# Patient Record
Sex: Female | Born: 1985 | Race: Black or African American | Hispanic: No | Marital: Single | Smoking: Never smoker
Health system: Southern US, Community
[De-identification: ages and names within clinical notes are randomized; demographics above are authoritative.]

## PROBLEM LIST (undated history)

## (undated) HISTORY — PX: LIPOSUCTION: SHX10

---

## 2016-01-21 ENCOUNTER — Encounter: Payer: Self-pay | Admitting: Family Medicine

## 2016-02-09 ENCOUNTER — Encounter: Payer: Self-pay | Admitting: Family Medicine

## 2016-12-06 ENCOUNTER — Encounter: Payer: Self-pay | Admitting: Family Medicine

## 2018-03-03 DIAGNOSIS — Z3202 Encounter for pregnancy test, result negative: Secondary | ICD-10-CM | POA: Diagnosis not present

## 2018-12-29 ENCOUNTER — Telehealth: Payer: Self-pay | Admitting: Obstetrics & Gynecology

## 2018-12-29 NOTE — Telephone Encounter (Signed)
Left a voicemail message instructing the patient of how enter the virtual visit via mychart. Informed the patient of logging in 15 minutes prior to the appointment and if she has any questions please call our office at [phone]. °

## 2018-12-30 ENCOUNTER — Encounter: Payer: Self-pay | Admitting: Student

## 2019-01-08 ENCOUNTER — Ambulatory Visit (INDEPENDENT_AMBULATORY_CARE_PROVIDER_SITE_OTHER): Payer: Medicaid Other | Admitting: Medical

## 2019-01-08 ENCOUNTER — Other Ambulatory Visit (HOSPITAL_COMMUNITY)
Admission: RE | Admit: 2019-01-08 | Discharge: 2019-01-08 | Disposition: A | Discharge status: Home / Self Care | Payer: Medicaid Other | Source: Ambulatory Visit | Attending: Medical | Admitting: Medical

## 2019-01-08 ENCOUNTER — Other Ambulatory Visit: Payer: Self-pay

## 2019-01-08 ENCOUNTER — Encounter: Payer: Self-pay | Admitting: Medical

## 2019-01-08 ENCOUNTER — Other Ambulatory Visit: Payer: Self-pay | Admitting: Medical

## 2019-01-08 VITALS — BP 147/106 | HR 78 | Ht 62.0 in | Wt 169.0 lb

## 2019-01-08 DIAGNOSIS — N644 Mastodynia: Secondary | ICD-10-CM

## 2019-01-08 DIAGNOSIS — Z01419 Encounter for gynecological examination (general) (routine) without abnormal findings: Secondary | ICD-10-CM | POA: Diagnosis not present

## 2019-01-08 DIAGNOSIS — Z0000 Encounter for general adult medical examination without abnormal findings: Secondary | ICD-10-CM

## 2019-01-08 DIAGNOSIS — R030 Elevated blood-pressure reading, without diagnosis of hypertension: Secondary | ICD-10-CM

## 2019-01-08 NOTE — Progress Notes (Signed)
Scheduled appt at [facility] at [facility] 8/10 @ 310p. Also scheduled appt with the [facility] 7/28 @ 150pm

## 2019-01-08 NOTE — Progress Notes (Signed)
Subjective:    Vanessa Solis is a 33 y.o. female who presents for an annual exam. The patient is sexually active. GYN screening history: last pap: approximate date 2018 and was normal. The patient wears seatbelts: yes. The patient participates in regular exercise: not asked. Has the patient ever been transfused or tattooed?: not asked. The patient reports that there is not domestic violence in her life.   The patient has recently moved here from [place] and would like to establish care. She denies any history of abnormal pap smears. She has regular periods. LMP 12/12/18. She is not currently using hormonal birth control. She uses NFP. She is sexually active with one partner. She does not desire pregnancy at this time.   She has had intermittent breast pain in the left breast. This was evaluated a few years ago in [place] and per patient was normal. She states pain has been more frequent and she has noted itching in the area as well.   She denies any other PMH or current medical problems.    Menstrual History: OB History    Gravida  2   Para  2   Term  2   Preterm  0   AB  0   Living  2     SAB  0   TAB  0   Ectopic  0   Multiple  0   Live Births  2           Patient's last menstrual period was 12/12/2018 (exact date).    The following portions of the patient's history were reviewed and updated as appropriate: allergies, current medications, past family history, past medical history, past social history, past surgical history and problem list.  Review of Systems Pertinent items are noted in HPI.    Objective:     Physical Exam  Nursing note and vitals reviewed. Constitutional: She is oriented to person, place, and time. She appears well-developed and well-nourished. No distress.  HENT:  Head: Normocephalic and atraumatic.  Neck: Normal range of motion. Neck supple. No thyromegaly present.  Cardiovascular: Normal rate, regular rhythm and normal heart sounds.   No murmur heard. Respiratory: Effort normal and breath sounds normal. No respiratory distress. She has no wheezes. Right breast exhibits no inverted nipple, no mass, no nipple discharge, no skin change and no tenderness. Left breast exhibits no inverted nipple, no mass, no nipple discharge, no skin change and no tenderness. No breast swelling, tenderness, discharge or bleeding. Breasts are symmetrical.  GI: Soft. Bowel sounds are normal. She exhibits no distension and no mass. There is no abdominal tenderness. There is no rebound and no guarding.  Few possible fibrocystic changes noted in the breast tissue. No focal masses or tenderness to palpation.   Genitourinary: There is no rash or tenderness on the right labia. There is no rash or tenderness on the left labia. Uterus is not enlarged and not tender. Cervix exhibits no motion tenderness, no discharge and no friability. Right adnexum displays no mass and no tenderness. Left adnexum displays no mass and no tenderness.    No vaginal discharge or bleeding.  No bleeding in the vagina.    Genitourinary Comments: Scant bleeding following pap smear   Musculoskeletal:        General: No edema.  Neurological: She is alert and oriented to person, place, and time.  Skin: Skin is warm and dry. No erythema.  Psychiatric: She has a normal mood and affect.   Marland Kitchen  Assessment:    Healthy female exam.   Breast pain  Elevated blood pressure    Plan:     Await pap smear results.   Patient will be contacted with abnormal results  Patient agrees to STD testing today  Breast US scheduled at the Breast center for further evaluation of breast pain  Referral to [facility] for HTN management   Marny LowensteinWenzel,  N, PA-C 01/08/2019 12:09 PM

## 2019-01-08 NOTE — Patient Instructions (Signed)
Pap Test Why am I having this test? A Pap test, also called a Pap smear, is a screening test to check for signs of:  Cancer of the vagina, cervix, and uterus. The cervix is the lower part of the uterus that opens into the vagina.  Infection.  Changes that may be a sign that cancer is developing (precancerous changes). Women need this test on a regular basis. In general, you should have a Pap test every 3 years until you reach menopause or age 33. Women aged 30-60 may choose to have their Pap test done at the same time as an HPV (human papillomavirus) test every 5 years (instead of every 3 years). Your health care provider may recommend having Pap tests more or less often depending on your medical conditions and past Pap test results. What kind of sample is taken?  Your health care provider will collect a sample of cells from the surface of your cervix. This will be done using a small cotton swab, plastic spatula, or brush. This sample is often collected during a pelvic exam, when you are lying on your back on an exam table with feet in footrests (stirrups). In some cases, fluids (secretions) from the cervix or vagina may also be collected. How do I prepare for this test?  Be aware of where you are in your menstrual cycle. If you are menstruating on the day of the test, you may be asked to reschedule.  You may need to reschedule if you have a known vaginal infection on the day of the test.  Follow instructions from your health care provider about: ? Changing or stopping your regular medicines. Some medicines can cause abnormal test results, such as digitalis and tetracycline. ? Avoiding douching or taking a bath the day before or the day of the test. Tell a health care provider about:  Any allergies you have.  All medicines you are taking, including vitamins, herbs, eye drops, creams, and over-the-counter medicines.  Any blood disorders you have.  Any surgeries you have had.  Any  medical conditions you have.  Whether you are pregnant or may be pregnant. How are the results reported? Your test results will be reported as either abnormal or normal. A false-positive result can occur. A false positive is incorrect because it means that a condition is present when it is not. A false-negative result can occur. A false negative is incorrect because it means that a condition is not present when it is. What do the results mean? A normal test result means that you do not have signs of cancer of the vagina, cervix, or uterus. An abnormal result may mean that you have:  Cancer. A Pap test by itself is not enough to diagnose cancer. You will have more tests done in this case.  Precancerous changes in your vagina, cervix, or uterus.  Inflammation of the cervix.  An STD (sexually transmitted disease).  A fungal infection.  A parasite infection. Talk with your health care provider about what your results mean. Questions to ask your health care provider Ask your health care provider, or the department that is doing the test:  When will my results be ready?  How will I get my results?  What are my treatment options?  What other tests do I need?  What are my next steps? Summary  In general, women should have a Pap test every 3 years until they reach menopause or age 33.  Your health care provider will collect a   sample of cells from the surface of your cervix. This will be done using a small cotton swab, plastic spatula, or brush.  In some cases, fluids (secretions) from the cervix or vagina may also be collected. This information is not intended to replace advice given to you by your health care provider. Make sure you discuss any questions you have with your health care provider. Document Released: 09/01/2002 Document Revised: 02/18/2017 Document Reviewed: 02/18/2017 Elsevier Patient Education  2020 [employer]. Hypertension, Adult Hypertension is another name  for high blood pressure. High blood pressure forces your heart to work harder to pump blood. This can cause problems over time. There are two numbers in a blood pressure reading. There is a top number (systolic) over a bottom number (diastolic). It is best to have a blood pressure that is below 120/80. Healthy choices can help lower your blood pressure, or you may need medicine to help lower it. What are the causes? The cause of this condition is not known. Some conditions may be related to high blood pressure. What increases the risk?  Smoking.  Having type 2 diabetes mellitus, high cholesterol, or both.  Not getting enough exercise or physical activity.  Being overweight.  Having too much fat, sugar, calories, or salt (sodium) in your diet.  Drinking too much alcohol.  Having long-term (chronic) kidney disease.  Having a family history of high blood pressure.  Age. Risk increases with age.  Race. You may be at higher risk if you are African American.  Gender. Men are at higher risk than women before age 25. After age 59, women are at higher risk than men.  Having obstructive sleep apnea.  Stress. What are the signs or symptoms?  High blood pressure may not cause symptoms. Very high blood pressure (hypertensive crisis) may cause: ? Headache. ? Feelings of worry or nervousness (anxiety). ? Shortness of breath. ? Nosebleed. ? A feeling of being sick to your stomach (nausea). ? Throwing up (vomiting). ? Changes in how you see. ? Very bad chest pain. ? Seizures. How is this treated?  This condition is treated by making healthy lifestyle changes, such as: ? Eating healthy foods. ? Exercising more. ? Drinking less alcohol.  Your health care provider may prescribe medicine if lifestyle changes are not enough to get your blood pressure under control, and if: ? Your top number is above 130. ? Your bottom number is above 80.  Your personal target blood pressure may vary.  Follow these instructions at home: Eating and drinking   If told, follow the DASH eating plan. To follow this plan: ? Fill one half of your plate at each meal with fruits and vegetables. ? Fill one fourth of your plate at each meal with whole grains. Whole grains include whole-wheat pasta, brown rice, and whole-grain bread. ? Eat or drink low-fat dairy products, such as skim milk or low-fat yogurt. ? Fill one fourth of your plate at each meal with low-fat (lean) proteins. Low-fat proteins include fish, chicken without skin, eggs, beans, and tofu. ? Avoid fatty meat, cured and processed meat, or chicken with skin. ? Avoid pre-made or processed food.  Eat less than 1,500 mg of salt each day.  Do not drink alcohol if: ? Your doctor tells you not to drink. ? You are pregnant, may be pregnant, or are planning to become pregnant.  If you drink alcohol: ? Limit how much you use to:  0-1 drink a day for women.  0-2 drinks  a day for men. ? Be aware of how much alcohol is in your drink. In the [place] one drink equals one 12 oz bottle of beer (355 mL), one 5 oz glass of wine (148 mL), or one 1 oz glass of hard liquor (44 mL). Lifestyle   Work with your doctor to stay at a healthy weight or to lose weight. Ask your doctor what the best weight is for you.  Get at least 30 minutes of exercise most days of the week. This may include walking, swimming, or biking.  Get at least 30 minutes of exercise that strengthens your muscles (resistance exercise) at least 3 days a week. This may include lifting weights or doing Pilates.  Do not use any products that contain nicotine or tobacco, such as cigarettes, e-cigarettes, and chewing tobacco. If you need help quitting, ask your doctor.  Check your blood pressure at home as told by your doctor.  Keep all follow-up visits as told by your doctor. This is important. Medicines  Take over-the-counter and prescription medicines only as told by your  doctor. Follow directions carefully.  Do not skip doses of blood pressure medicine. The medicine does not work as well if you skip doses. Skipping doses also puts you at risk for problems.  Ask your doctor about side effects or reactions to medicines that you should watch for. Contact a doctor if you:  Think you are having a reaction to the medicine you are taking.  Have headaches that keep coming back (recurring).  Feel dizzy.  Have swelling in your ankles.  Have trouble with your vision. Get help right away if you:  Get a very bad headache.  Start to feel mixed up (confused).  Feel weak or numb.  Feel faint.  Have very bad pain in your: ? Chest. ? Belly (abdomen).  Throw up more than once.  Have trouble breathing. Summary  Hypertension is another name for high blood pressure.  High blood pressure forces your heart to work harder to pump blood.  For most people, a normal blood pressure is less than 120/80.  Making healthy choices can help lower blood pressure. If your blood pressure does not get lower with healthy choices, you may need to take medicine. This information is not intended to replace advice given to you by your health care provider. Make sure you discuss any questions you have with your health care provider. Document Released: 11/28/2007 Document Revised: 02/19/2018 Document Reviewed: 02/19/2018 Elsevier Patient Education  2020 [employer]

## 2019-01-14 LAB — CYTOLOGY - PAP
Chlamydia: NEGATIVE
Diagnosis: NEGATIVE
HPV: NOT DETECTED
Neisseria Gonorrhea: NEGATIVE
Trichomonas: NEGATIVE

## 2019-01-20 ENCOUNTER — Other Ambulatory Visit: Payer: Medicaid Other

## 2019-01-28 ENCOUNTER — Other Ambulatory Visit: Payer: Medicaid Other

## 2019-02-02 ENCOUNTER — Ambulatory Visit: Payer: Medicaid Other | Admitting: Family Medicine

## 2019-02-05 ENCOUNTER — Other Ambulatory Visit: Payer: Self-pay | Admitting: Medical

## 2019-02-06 ENCOUNTER — Ambulatory Visit: Payer: Medicaid Other

## 2019-02-06 ENCOUNTER — Other Ambulatory Visit: Payer: Self-pay

## 2019-02-06 ENCOUNTER — Ambulatory Visit
Admission: RE | Admit: 2019-02-06 | Discharge: 2019-02-06 | Disposition: A | Discharge status: Home / Self Care | Payer: Medicaid Other | Source: Ambulatory Visit | Attending: Medical | Admitting: Medical

## 2019-02-06 DIAGNOSIS — N644 Mastodynia: Secondary | ICD-10-CM

## 2019-02-06 DIAGNOSIS — R928 Other abnormal and inconclusive findings on diagnostic imaging of breast: Secondary | ICD-10-CM | POA: Diagnosis not present

## 2019-02-20 ENCOUNTER — Telehealth: Payer: Self-pay

## 2019-02-20 NOTE — Telephone Encounter (Signed)
Called patient to do their pre-visit COVID screening.  Call went to voicemail. Unable to do prescreening.  

## 2019-02-23 ENCOUNTER — Ambulatory Visit: Payer: Medicaid Other | Admitting: Family Medicine

## 2019-04-22 DIAGNOSIS — I10 Essential (primary) hypertension: Secondary | ICD-10-CM | POA: Diagnosis not present

## 2019-04-22 DIAGNOSIS — R58 Hemorrhage, not elsewhere classified: Secondary | ICD-10-CM | POA: Diagnosis not present

## 2019-04-22 DIAGNOSIS — W19XXXA Unspecified fall, initial encounter: Secondary | ICD-10-CM | POA: Diagnosis not present

## 2019-04-28 ENCOUNTER — Ambulatory Visit (HOSPITAL_COMMUNITY)
Admission: EM | Admit: 2019-04-28 | Discharge: 2019-04-28 | Disposition: A | Discharge status: Home / Self Care | Payer: Medicaid Other | Attending: Emergency Medicine | Admitting: Emergency Medicine

## 2019-04-28 DIAGNOSIS — Z4802 Encounter for removal of sutures: Secondary | ICD-10-CM

## 2019-04-28 NOTE — ED Triage Notes (Signed)
Pt presents to have 8 sutures removed from forehead from an injury from a few days ago.

## 2019-11-02 ENCOUNTER — Encounter (HOSPITAL_COMMUNITY): Payer: Self-pay

## 2019-11-02 ENCOUNTER — Ambulatory Visit (HOSPITAL_COMMUNITY)
Admission: EM | Admit: 2019-11-02 | Discharge: 2019-11-02 | Disposition: A | Discharge status: Home / Self Care | Payer: Medicaid Other | Attending: Family Medicine | Admitting: Family Medicine

## 2019-11-02 ENCOUNTER — Other Ambulatory Visit: Payer: Self-pay

## 2019-11-02 DIAGNOSIS — I10 Essential (primary) hypertension: Secondary | ICD-10-CM | POA: Diagnosis not present

## 2019-11-02 DIAGNOSIS — K011 Impacted teeth: Secondary | ICD-10-CM | POA: Diagnosis not present

## 2019-11-02 MED ORDER — AMLODIPINE BESYLATE 5 MG PO TABS: 5.0000 mg | ORAL_TABLET | Freq: Every day | ORAL | 1 refills | Status: DC

## 2019-11-02 NOTE — Discharge Instructions (Addendum)
Keep your follow up appointment with your new primary care provider. You may return here if needed.

## 2019-11-02 NOTE — ED Provider Notes (Signed)
[facility]   [id number] 11/02/19 Arrival Time: 1045  ASSESSMENT & PLAN:  1. Essential hypertension     BegiN: Meds ordered this encounter  Medications  . amLODipine (NORVASC) 5 MG tablet    Sig: Take 1 tablet (5 mg total) by mouth daily.    Dispense:  30 tablet    Refill:  1    Has scheduled f/u with new PCP. May f/u here as needed. See AVS for written information on HTN.   Reviewed expectations re: course of current medical issues. Questions answered. Outlined signs and symptoms indicating need for more acute intervention. Patient verbalized understanding. After Visit Summary given.   SUBJECTIVE:  Vanessa Solis is a 34 y.o. female who presents with concerns regarding increased blood pressures. Noted high at recent dentist visit. She reports that she has not been treated for hypertension in the past.  She reports taking medications as instructed, no medication side effects noted, no TIA's, no chest pain on exertion, no dyspnea on exertion and no swelling of ankles.   Social History   Tobacco Use  Smoking Status Never Smoker  Smokeless Tobacco Never Used      OBJECTIVE:  Vitals:   11/02/19 1143  BP: (!) 145/104  Pulse: 65  Resp: 18  Temp: 97.8 F (36.6 C)  SpO2: 100%    General appearance: alert; no distress Eyes: PERRLA; EOMI HENT: normocephalic; atraumatic Neck: supple Lungs: speaks full sentences without difficulty; unlabored Extremities: no edema Skin: warm and dry Psychological: alert and cooperative; normal mood and affect   Allergies  Allergen Reactions  . Fish Allergy     History reviewed. No pertinent past medical history.   Social History   Socioeconomic History  . Marital status: Married    Spouse name: Not on file  . Number of children: Not on file  . Years of education: Not on file  . Highest education level: Not on file  Occupational History  . Not on file  Tobacco Use  . Smoking status: Never Smoker  .  Smokeless tobacco: Never Used  Substance and Sexual Activity  . Alcohol use: Not Currently  . Drug use: Never  . Sexual activity: Yes    Birth control/protection: None  Other Topics Concern  . Not on file  Social History Narrative  . Not on file   Social Determinants of Health   Financial Resource Strain:   . Difficulty of Paying Living Expenses:   Food Insecurity:   . Worried About Programme researcher, broadcasting/film/video in the Last Year:   . Barista in the Last Year:   Transportation Needs:   . Freight forwarder (Medical):   Marland Kitchen Lack of Transportation (Non-Medical):   Physical Activity:   . Days of Exercise per Week:   . Minutes of Exercise per Session:   Stress:   . Feeling of Stress :   Social Connections:   . Frequency of Communication with Friends and Family:   . Frequency of Social Gatherings with Friends and Family:   . Attends Religious Services:   . Active Member of Clubs or Organizations:   . Attends Banker Meetings:   Marland Kitchen Marital Status:   Intimate Partner Violence:   . Fear of Current or Ex-Partner:   . Emotionally Abused:   Marland Kitchen Physically Abused:   . Sexually Abused:    Family History  Problem Relation Age of Onset  . Healthy Mother    Past Surgical History:  Procedure  Laterality Date  . LIPOSUCTION        Vanessa Kick, MD 11/02/19 1227

## 2019-11-02 NOTE — ED Triage Notes (Signed)
Pt states while at dentist, she had her BP taken and was told it was "high". Pt reports 165/120 and had two other measurements taken with high results as well.  Pt reports had HA two days ago, now resolved. Denies blurred vision, CP, slurred speech or difficulty ambulating/moving extremities.  C/o chronic shoulder ache

## 2019-11-26 ENCOUNTER — Ambulatory Visit (INDEPENDENT_AMBULATORY_CARE_PROVIDER_SITE_OTHER): Payer: Medicaid Other | Admitting: Family Medicine

## 2019-11-26 ENCOUNTER — Other Ambulatory Visit: Payer: Self-pay

## 2019-11-26 ENCOUNTER — Encounter: Payer: Self-pay | Admitting: Family Medicine

## 2019-11-26 VITALS — BP 124/88 | HR 69 | Ht 62.0 in | Wt 149.6 lb

## 2019-11-26 DIAGNOSIS — Z0000 Encounter for general adult medical examination without abnormal findings: Secondary | ICD-10-CM

## 2019-11-26 DIAGNOSIS — I10 Essential (primary) hypertension: Secondary | ICD-10-CM | POA: Diagnosis not present

## 2019-11-26 DIAGNOSIS — Z7689 Persons encountering health services in other specified circumstances: Secondary | ICD-10-CM | POA: Diagnosis not present

## 2019-11-26 DIAGNOSIS — Z114 Encounter for screening for human immunodeficiency virus [HIV]: Secondary | ICD-10-CM | POA: Diagnosis not present

## 2019-11-26 DIAGNOSIS — L680 Hirsutism: Secondary | ICD-10-CM | POA: Diagnosis not present

## 2019-11-26 LAB — POCT GLYCOSYLATED HEMOGLOBIN (HGB A1C): Hemoglobin A1C: 5 % (ref 4.0–5.6)

## 2019-11-26 LAB — POCT URINE PREGNANCY: Preg Test, Ur: NEGATIVE

## 2019-11-26 MED ORDER — AMLODIPINE BESYLATE 5 MG PO TABS: 5.0000 mg | ORAL_TABLET | Freq: Every day | ORAL | 1 refills | Status: DC

## 2019-11-26 NOTE — Progress Notes (Signed)
New Patient Visit Subjective:  CC: Establish care + f/u HTN    HPI Malaka Ruffner Ferrufino is a 34 y.o. female who presents today to establish care. She also complains of the following:   Hypertension  Maternal history of high blood pressure. Was diagnosed at urgent care and started on amlodipine and instructions to f/u with PCP.   HISTORY Reviewed with patient and updated in EMR as appropriate.  Allergies  Allergen Reactions  . Fish Allergy     Current Outpatient Medications:  .  amLODipine (NORVASC) 5 MG tablet, Take 1 tablet (5 mg total) by mouth daily., Disp: 30 tablet, Rfl: 1 .  Multiple Vitamins-Minerals (MULTIVITAMIN WITH MINERALS) tablet, Take 1 tablet by mouth daily., Disp: , Rfl:   Past Medical History:  History reviewed. No pertinent past medical history. Past Surgical History:  Procedure Laterality Date  . LIPOSUCTION     OB/Gyn History:   LMP: Patient's last menstrual period was 10/25/2019 (exact date). OB History    Gravida  2   Para  2   Term  2   Preterm  0   AB  0   Living  2     SAB  0   TAB  0   Ectopic  0   Multiple  0   Live Births  2         No birth control.  Not really sexually active.   Health Maintenance:  Health Maintenance  Topic Date Due  . Tetanus Vaccine  Never done  . Flu Shot  01/24/2020  . Pap Smear  01/08/2024  . HIV Screening  Completed   Eye doctor: none  Dentist: Dr. Lorin Picket- needs a tooth pulled soon.   Family History Desarea's family history includes Healthy in her mother.   Social History  Kitana's  reports that she has never smoked. She has never used smokeless tobacco. She reports previous alcohol use. She reports that she does not use drugs..  Social History   Social History Narrative   Moved from [place] to [place] for lower pace lifestyle. Son is 18, daughter 59. Son with high functioning, non-verbal autism-- Lives with husband.    Work: Water quality scientist, retired Education officer, environmental.       No drug use   No tobacco  use    1 glass a wine a day- usually takes ~ 2 weeks to get through a bottle of wine.   .  Objective:  Physical Exam:  BP 124/88   Pulse 69   Ht 5\' 2"  (1.575 m)   Wt 149 lb 9.6 oz (67.9 kg)   LMP 10/25/2019 (Exact Date)   SpO2 100%   BMI 27.36 kg/m   Gen: NAD, alert, non-toxic, pleasant HEENT: Normocephaic, atraumatic. PERRLA, clear conjuctiva, no scleral icterus and injection. Normal EOM. Mild-mod terminal hair growth on chin and upper neck.  Hearing intact. TM pearly grey bilaterally with no fluid. Neck supple with no LAD, nodules, or gross abnormality.  Nares patent with no discharge.  Oropharynx without erythema and lesions.  Tonsils nonswollen and without exudate.   CV: Regular rate and rhythm.  Normal S1-S2.  No murmur, gallops, S3, S4 appreciated.  Normal capillary refill bilaterally.  Radial pulses 2+ bilaterally. No bilateral lower extremity edema. Resp: Clear to auscultation bilaterally.  No wheezing, rales, rhonchi, or other abnormal lung sounds.  No increased work of breathing appreciated. Abd: Nontender and nondistended on palpation to all 4 quadrants.  Positive bowel sounds. Skin:  No obvious rashes, lesions, or trauma.  Normal turgor. Arms, back, abdomen negative for terminal hair growth.  MSK: Normal ROM. Normal strength and tone.  Neuro: Cranial nerves II through VI grossly intact. Gait normal.  Alert and oriented x4.  No obvious abnormal movements. Psych: Cooperative with exam.  Normal speech. Pleasant. Makes good eye contact. Genitourinary: deferred.   Assessment & Plan:  Hypertension Patient's blood pressures at home appear to be very stable around the 120s over 80s.  This is with the amlodipine 5 mg.  Gave patient the choice to stay on medication or trial off of it.  Patient would like to continue with the amlodipine.  We will continue to monitor.  Return precautions provided.  Goal parameters discussed. UPC, lipid panel obtained.   Hirsutism Patient brings up  thick terminal hairs on her chin at the end of the appointment.  She reports that this only started after the birth of her last child 5 years ago.  She reports that she does have irregular cycles when she was not on her birth control, this includes when she was younger but denies any other hirsute features prior to five years ago. Patient denies any other sites of terminal or excessive hair growth.  Denies any other symptoms of androgen excess - no acne, androgenetic alopecia.  Will obtain some work-up labs today.  Discussed causes of hirsutism, including idiopathic to set expectations for management. PCOS high on differential as patient meets [place] Criteria. Will obtain tests to rule out other causes TSH, prolactin, UPT. Unlikely CAH. Can consider obtaining early morning 17-hydroxyprogesterone.  Laser hair removal, hormonal therapy are options for treatment.     Follow up: No future appointments.  Wilber Oliphant, MD 11/30/19, 11:49 AM

## 2019-11-26 NOTE — Patient Instructions (Addendum)
Dear Vanessa Solis,   It was good to see you! Thank you for taking your time to come in to be seen. Today, we discussed the following:   Establish Care High blood Pressure: continue amlodipine and sent refills  Unwanted Hair: we will get some labs today. I will let you know with results and next steps   You are due for the following Health Maintenance items. Please schedule an appointment to address these prior to leaving.  Health Maintenance Due  Topic Date Due  . COVID-19 Vaccine (1) Never done  . HIV Screening  Never done  . TETANUS/TDAP  Never done    Be well,   Zettie Cooley, M.D   [facility] [phone]  *Sign up for MyChart for instant access to your health profile, labs, orders, upcoming appointments or to contact your provider with questions*  ===================================================================================  Polycystic Ovarian Syndrome  Polycystic ovarian syndrome (PCOS) is a common hormonal disorder among women of reproductive age. In most women with PCOS, many small fluid-filled sacs (cysts) grow on the ovaries, and the cysts are not part of a normal menstrual cycle. PCOS can cause problems with your menstrual periods and make it difficult to get pregnant. It can also cause an increased risk of miscarriage with pregnancy. If it is not treated, PCOS can lead to serious health problems, such as diabetes and heart disease. What are the causes? The cause of PCOS is not known, but it may be the result of a combination of certain factors, such as:  Irregular menstrual cycle.  High levels of certain hormones (androgens).  Problems with the hormone that helps to control blood sugar (insulin resistance).  Certain genes. What increases the risk? This condition is more likely to develop in women who have a family history of PCOS. What are the signs or symptoms? Symptoms of PCOS may include:  Multiple ovarian cysts.  Infrequent periods  or no periods.  Periods that are too frequent or too heavy.  Unpredictable periods.  Inability to get pregnant (infertility) because of not ovulating.  Increased growth of hair on the face, chest, stomach, back, thumbs, thighs, or toes.  Acne or oily skin. Acne may develop during adulthood, and it may not respond to treatment.  Pelvic pain.  Weight gain or obesity.  Patches of thickened and dark brown or black skin on the neck, arms, breasts, or thighs (acanthosis nigricans).  Excess hair growth on the face, chest, abdomen, or upper thighs (hirsutism). How is this diagnosed? This condition is diagnosed based on:  Your medical history.  A physical exam, including a pelvic exam. Your health care provider may look for areas of increased hair growth on your skin.  Tests, such as: ? Ultrasound. This may be used to examine the ovaries and the lining of the uterus (endometrium) for cysts. ? Blood tests. These may be used to check levels of sugar (glucose), female hormone (testosterone), and female hormones (estrogen and progesterone) in your blood. How is this treated? There is no cure for PCOS, but treatment can help to manage symptoms and prevent more health problems from developing. Treatment varies depending on:  Your symptoms.  Whether you want to have a baby or whether you need birth control (contraception). Treatment may include nutrition and lifestyle changes along with:  Progesterone hormone to start a menstrual period.  Birth control pills to help you have regular menstrual periods.  Medicines to make you ovulate, if you want to get pregnant.  Medicine  to reduce excessive hair growth.  Surgery, in severe cases. This may involve making small holes in one or both of your ovaries. This decreases the amount of testosterone that your body produces. Follow these instructions at home:  Take over-the-counter and prescription medicines only as told by your health care  provider.  Follow a healthy meal plan. This can help you reduce the effects of PCOS. ? Eat a healthy diet that includes lean proteins, complex carbohydrates, fresh fruits and vegetables, low-fat dairy products, and healthy fats. Make sure to eat enough fiber.  If you are overweight, lose weight as told by your health care provider. ? Losing 10% of your body weight may improve symptoms. ? Your health care provider can determine how much weight loss is best for you and can help you lose weight safely.  Keep all follow-up visits as told by your health care provider. This is important. Contact a health care provider if:  Your symptoms do not get better with medicine.  You develop new symptoms. This information is not intended to replace advice given to you by your health care provider. Make sure you discuss any questions you have with your health care provider. Document Revised: 05/24/2017 Document Reviewed: 11/27/2015 Elsevier Patient Education  2020 [employer].  Hirsutism and Masculinization Hirsutism is when a female has an excessive amount of hair in an area where a female would typically have hair growth, such as on the face, chest, or back. Masculinization is when a female's body develops certain characteristics that are like a female's body. What are the causes? Although many things can cause these conditions, the most common cause is polycystic ovary syndrome (PCOS). Taking certain medicines, such as androgens and anabolic steroids, may also cause these conditions. What are the signs or symptoms? Hirsutism Symptoms of this condition include excess hair growth on the:  Face.  Chest.  Thighs.  Back. Masculinization Symptoms of this condition include:  Deepening voice.  Loss of breast tissue.  Increased muscle mass.  Thinning hair on the head (balding).  Irregular or loss of menstrual periods. [employer] may recommend that you follow up with specialists to  understand the cause of your condition or to help treat your condition.  How is this tred clitoris. How is this diagnosed? This condition is diagnosed based on a health history, physical exam, and tests. Tests may include blood tests and imaging studies. Your health care pated? This condition may be treated by:  Taking medicines to help control hair growth.  Making lifestyle changes to help reduce hormone levels that are contributing to your condition.  Removing unwanted hair by shaving, using creams, or waxing. More permanent ways to remove hair include electrolysis and laser treatments. If your symptoms are caused by certain medicines, your health care provider may have you stop taking them. Follow these instructions at home:  Take over-the-counter and prescription medicines only as told by your health care provider.  Talk to your health care provider about your treatment options and what may be best for you.  Maintain a healthy weight. If needed, talk to your health care provider about how to lose weight.  Keep all follow-up visits as told by your health care provider. This is important. Contact a health care provider if:  Your symptoms suddenly get worse.  You develop acne.  You have irregular menstrual periods.  You stop having your period.  You feel a lump in your lower abdomen. Summary  Hirsutism is when a female has an  excessive amount of hair in an area where a female would typically have hair growth, such as on the face, chest, thighs, or back.  Masculinization is when a female's body develops characteristics like a female's body. This may include a deepening voice, loss of breast tissue, increased muscle mass, thinning hair (balding), changes in menstrual periods, and clitoris growth.  The most common cause of hirsutism is polycystic ovary syndrome (PCOS).  Treatment options include removing unwanted hair, taking medicines, and making lifestyle changes. Talk to your  health care provider about which treatments are best for you.  Your health care provider may refer to you to specialists to find the cause of your condition. This information is not intended to replace advice given to you by your health care provider. Make sure you discuss any questions you have with your health care provider. Document Revised: 04/22/2018 Document Reviewed: 04/22/2018 Elsevier Patient Education  2020 [employer].

## 2019-11-27 LAB — COMPREHENSIVE METABOLIC PANEL
ALT: 15 IU/L (ref 0–32)
AST: 16 IU/L (ref 0–40)
Albumin/Globulin Ratio: 1.7 (ref 1.2–2.2)
Albumin: 4.8 g/dL (ref 3.8–4.8)
Alkaline Phosphatase: 62 IU/L (ref 48–121)
BUN/Creatinine Ratio: 14 (ref 9–23)
BUN: 10 mg/dL (ref 6–20)
Bilirubin Total: 0.4 mg/dL (ref 0.0–1.2)
CO2: 21 mmol/L (ref 20–29)
Calcium: 10.1 mg/dL (ref 8.7–10.2)
Chloride: 101 mmol/L (ref 96–106)
Creatinine, Ser: 0.72 mg/dL (ref 0.57–1.00)
GFR calc Af Amer: 126 mL/min/{1.73_m2} (ref >59)
GFR calc non Af Amer: 110 mL/min/{1.73_m2} (ref >59)
Globulin, Total: 2.8 g/dL (ref 1.5–4.5)
Glucose: 86 mg/dL (ref 65–99)
Potassium: 4.5 mmol/L (ref 3.5–5.2)
Sodium: 138 mmol/L (ref 134–144)
Total Protein: 7.6 g/dL (ref 6.0–8.5)

## 2019-11-27 LAB — LIPID PANEL
Chol/HDL Ratio: 2.4 ratio (ref 0.0–4.4)
Cholesterol, Total: 183 mg/dL (ref 100–199)
HDL: 77 mg/dL (ref >39)
LDL Chol Calc (NIH): 95 mg/dL (ref 0–99)
Triglycerides: 55 mg/dL (ref 0–149)
VLDL Cholesterol Cal: 11 mg/dL (ref 5–40)

## 2019-11-27 LAB — HIV ANTIBODY (ROUTINE TESTING W REFLEX): HIV Screen 4th Generation wRfx: NONREACTIVE

## 2019-11-27 LAB — TSH: TSH: 1.32 u[IU]/mL (ref 0.450–4.500)

## 2019-11-27 LAB — PROLACTIN: Prolactin: 35.7 ng/mL — ABNORMAL HIGH (ref 4.8–23.3)

## 2019-11-30 ENCOUNTER — Encounter: Payer: Self-pay | Admitting: Family Medicine

## 2019-11-30 DIAGNOSIS — L680 Hirsutism: Secondary | ICD-10-CM | POA: Insufficient documentation

## 2019-11-30 DIAGNOSIS — Z7689 Persons encountering health services in other specified circumstances: Secondary | ICD-10-CM | POA: Insufficient documentation

## 2019-11-30 DIAGNOSIS — I10 Essential (primary) hypertension: Secondary | ICD-10-CM | POA: Insufficient documentation

## 2019-11-30 NOTE — Assessment & Plan Note (Addendum)
Patient's blood pressures at home appear to be very stable around the 120s over 80s.  This is with the amlodipine 5 mg.  Gave patient the choice to stay on medication or trial off of it.  Patient would like to continue with the amlodipine.  We will continue to monitor.  Return precautions provided.  Goal parameters discussed. UPC, lipid panel obtained.

## 2019-11-30 NOTE — Assessment & Plan Note (Signed)
Patient brings up thick terminal hairs on her chin at the end of the appointment.  She reports that this only started after the birth of her last child 5 years ago.  She reports that she does have irregular cycles when she was not on her birth control, this includes when she was younger but denies any other hirsute features prior to five years ago. Patient denies any other sites of terminal or excessive hair growth.  Denies any other symptoms of androgen excess - no acne, androgenetic alopecia.  Will obtain some work-up labs today.  Discussed causes of hirsutism, including idiopathic to set expectations for management. PCOS high on differential as patient meets [place] Criteria. Will obtain tests to rule out other causes TSH, prolactin, UPT. Unlikely CAH. Can consider obtaining early morning 17-hydroxyprogesterone.  Laser hair removal, hormonal therapy are options for treatment.

## 2019-11-30 NOTE — Addendum Note (Signed)
Addended by: Genia Hotter E on: 11/30/2019 02:14 PM   Modules accepted: Orders

## 2019-12-01 LAB — PROTEIN / CREATININE RATIO, URINE
Creatinine, Urine: 34.9 mg/dL
Protein, Ur: 10.1 mg/dL
Protein/Creat Ratio: 289 mg/g creat — ABNORMAL HIGH (ref 0–200)

## 2019-12-04 ENCOUNTER — Other Ambulatory Visit: Payer: Self-pay | Admitting: Family Medicine

## 2019-12-04 ENCOUNTER — Other Ambulatory Visit: Payer: Self-pay

## 2019-12-04 ENCOUNTER — Other Ambulatory Visit: Payer: Medicaid Other

## 2019-12-04 DIAGNOSIS — L680 Hirsutism: Secondary | ICD-10-CM

## 2019-12-08 ENCOUNTER — Encounter: Payer: Self-pay | Admitting: Family Medicine

## 2019-12-09 LAB — PROLACTIN: Prolactin: 46.7 ng/mL — ABNORMAL HIGH (ref 4.8–23.3)

## 2019-12-09 LAB — DHEA: Dehydroepiandrosterone: 648 ng/dL (ref 31–701)

## 2020-01-01 MED ORDER — AMLODIPINE BESYLATE 5 MG PO TABS: 5.0000 mg | ORAL_TABLET | Freq: Every day | ORAL | 2 refills | Status: DC

## 2020-02-18 ENCOUNTER — Encounter: Payer: Self-pay | Admitting: Family Medicine

## 2020-03-18 DIAGNOSIS — Z20822 Contact with and (suspected) exposure to covid-19: Secondary | ICD-10-CM | POA: Diagnosis not present

## 2020-06-30 DIAGNOSIS — U071 COVID-19: Secondary | ICD-10-CM | POA: Diagnosis not present

## 2020-12-01 ENCOUNTER — Encounter: Payer: Self-pay | Admitting: Family Medicine

## 2020-12-01 DIAGNOSIS — I10 Essential (primary) hypertension: Secondary | ICD-10-CM

## 2020-12-01 MED ORDER — AMLODIPINE BESYLATE 5 MG PO TABS: 5.0000 mg | ORAL_TABLET | Freq: Every day | ORAL | 0 refills | Status: DC

## 2020-12-01 NOTE — Telephone Encounter (Signed)
Please call patient and make an appointment for blood pressure follow-up for future refills.

## 2020-12-05 NOTE — Telephone Encounter (Signed)
Called and scheduled appointment.

## 2020-12-14 ENCOUNTER — Ambulatory Visit: Payer: Medicaid Other | Admitting: Family Medicine

## 2021-01-19 ENCOUNTER — Ambulatory Visit: Payer: Medicaid Other | Admitting: Student

## 2021-02-09 ENCOUNTER — Ambulatory Visit: Payer: Medicaid Other | Admitting: Student

## 2021-03-08 ENCOUNTER — Ambulatory Visit: Payer: Medicaid Other | Admitting: Student

## 2021-03-08 NOTE — Assessment & Plan Note (Deleted)
BP well-controlled and within goal parameters. stable at home. Will continue Amlodipine 5mg  daily. Will continue to monitor. Return precautions and goal BP discussed.

## 2021-03-14 ENCOUNTER — Other Ambulatory Visit: Payer: Self-pay

## 2021-03-14 NOTE — Telephone Encounter (Signed)
Pt has an appt 10/14 with PCP. Pt will run out of amlodipine before the appt. Sunday Spillers, CMA

## 2021-03-15 MED ORDER — AMLODIPINE BESYLATE 5 MG PO TABS: 5.0000 mg | ORAL_TABLET | Freq: Every day | ORAL | 0 refills | Status: DC

## 2021-04-05 ENCOUNTER — Ambulatory Visit: Payer: Medicaid Other | Admitting: Family Medicine

## 2021-04-07 ENCOUNTER — Ambulatory Visit: Payer: Medicaid Other | Admitting: Student

## 2021-04-14 ENCOUNTER — Emergency Department (HOSPITAL_COMMUNITY): Payer: Medicaid Other

## 2021-04-14 ENCOUNTER — Emergency Department (HOSPITAL_COMMUNITY)
Admission: EM | Admit: 2021-04-14 | Discharge: 2021-04-14 | Disposition: A | Discharge status: Home / Self Care | Payer: Medicaid Other | Attending: Emergency Medicine | Admitting: Emergency Medicine

## 2021-04-14 ENCOUNTER — Encounter (HOSPITAL_COMMUNITY): Payer: Self-pay

## 2021-04-14 DIAGNOSIS — Z79899 Other long term (current) drug therapy: Secondary | ICD-10-CM | POA: Insufficient documentation

## 2021-04-14 DIAGNOSIS — I10 Essential (primary) hypertension: Secondary | ICD-10-CM | POA: Insufficient documentation

## 2021-04-14 DIAGNOSIS — M25512 Pain in left shoulder: Secondary | ICD-10-CM | POA: Diagnosis not present

## 2021-04-14 MED ORDER — NAPROXEN 500 MG PO TABS: ORAL_TABLET | ORAL | 0 refills | Status: AC

## 2021-04-14 NOTE — ED Triage Notes (Signed)
Pt arrived via POV, c/o left arm pain, denies any trauma to area. States pain kept her up all last night.

## 2021-04-14 NOTE — Discharge Instructions (Addendum)
Follow-up with your family doctor as planned in November for your blood pressure and recheck of your shoulder

## 2021-04-14 NOTE — ED Provider Notes (Signed)
[facility]-EMERGENCY DEPT Provider Note   CSN: [id number] Arrival date & time: 04/14/21  2952     History No chief complaint on file.   Vanessa Solis is a 35 y.o. female.  Patient complains of left shoulder pain.  Patient works at the [employer].  No history of any injury  The history is provided by the patient and medical records. A language interpreter was used.  Shoulder Pain Location:  Shoulder Shoulder location:  L shoulder Pain details:    Quality:  Aching   Radiates to:  Does not radiate   Severity:  Moderate   Onset quality:  Sudden   Timing:  Constant Dislocation: no   Foreign body present:  No foreign bodies Tetanus status:  Unknown Associated symptoms: no back pain and no fatigue       History reviewed. No pertinent past medical history.  Patient Active Problem List   Diagnosis Date Noted   Hypertension 11/30/2019   Hirsutism 11/30/2019   Encounter to establish care 11/30/2019    Past Surgical History:  Procedure Laterality Date   LIPOSUCTION       OB History     Gravida  2   Para  2   Term  2   Preterm  0   AB  0   Living  2      SAB  0   IAB  0   Ectopic  0   Multiple  0   Live Births  2           Family History  Problem Relation Age of Onset   Healthy Mother     Social History   Tobacco Use   Smoking status: Never   Smokeless tobacco: Never  Substance Use Topics   Alcohol use: Not Currently   Drug use: Never    Home Medications Prior to Admission medications   Medication Sig Start Date End Date Taking? Authorizing Provider  naproxen (NAPROSYN) 500 MG tablet Take 1 pill twice a day for discomfort in her left shoulder 04/14/21  Yes Bethann Berkshire, MD  amLODipine (NORVASC) 5 MG tablet Take 1 tablet (5 mg total) by mouth daily. 03/15/21   Dameron, Nolberto Hanlon, DO  Multiple Vitamins-Minerals (MULTIVITAMIN WITH MINERALS) tablet Take 1 tablet by mouth daily.    [provider]     Allergies    Fish allergy  Review of Systems   Review of Systems  Constitutional:  Negative for appetite change and fatigue.  HENT:  Negative for congestion, ear discharge and sinus pressure.   Eyes:  Negative for discharge.  Respiratory:  Negative for cough.   Cardiovascular:  Negative for chest pain.  Gastrointestinal:  Negative for abdominal pain and diarrhea.  Genitourinary:  Negative for frequency and hematuria.  Musculoskeletal:  Negative for back pain.       Left shoulder discomfort  Skin:  Negative for rash.  Neurological:  Negative for seizures and headaches.  Psychiatric/Behavioral:  Negative for hallucinations.    Physical Exam Updated Vital Signs BP 124/86   Pulse 63   Temp 98.5 F (36.9 C) (Oral)   Resp 16   LMP 03/20/2021   SpO2 98%   Physical Exam Vitals and nursing note reviewed.  Constitutional:      Appearance: She is well-developed.  HENT:     Head: Normocephalic.     Nose: Nose normal.  Eyes:     General: No scleral icterus.    Conjunctiva/sclera: Conjunctivae normal.  Neck:     Thyroid: No thyromegaly.  Cardiovascular:     Rate and Rhythm: Normal rate and regular rhythm.     Heart sounds: No murmur heard.   No friction rub. No gallop.  Pulmonary:     Breath sounds: No stridor. No wheezing or rales.  Chest:     Chest wall: No tenderness.  Abdominal:     General: There is no distension.     Tenderness: There is no abdominal tenderness. There is no rebound.  Musculoskeletal:     Cervical back: Neck supple.     Comments: Tenderness to left shoulder with full range of motion  Lymphadenopathy:     Cervical: No cervical adenopathy.  Skin:    Findings: No erythema or rash.  Neurological:     Mental Status: She is alert and oriented to person, place, and time.     Motor: No abnormal muscle tone.     Coordination: Coordination normal.  Psychiatric:        Behavior: Behavior normal.    ED Results / Procedures / Treatments   Labs (all  labs ordered are listed, but only abnormal results are displayed) Labs Reviewed - No data to display  EKG None  Radiology DG Shoulder Left  Result Date: 04/14/2021 CLINICAL DATA:  Pain EXAM: LEFT SHOULDER - 2+ VIEW COMPARISON:  None. FINDINGS: There is no acute fracture or dislocation. Shoulder alignment is normal. The joint spaces are preserved. The soft tissues are unremarkable. IMPRESSION: Unremarkable shoulder radiographs. Electronically Signed   By: Lesia Hausen M.D.   On: 04/14/2021 09:36    Procedures Procedures   Medications Ordered in ED Medications - No data to display  ED Course  I have reviewed the triage vital signs and the nursing notes.  Pertinent labs & imaging results that were available during my care of the patient were reviewed by me and considered in my medical decision making (see chart for details).    MDM Rules/Calculators/A&P                           Patient with inflamed left shoulder.  She is given Naprosyn for that.  Also patient has poorly controlled blood pressure and she is going to follow-up with her PCP within 10 days for her shoulder and her blood pressure Final Clinical Impression(s) / ED Diagnoses Final diagnoses:  Acute pain of left shoulder    Rx / DC Orders ED Discharge Orders          Ordered    naproxen (NAPROSYN) 500 MG tablet        04/14/21 1735             Bethann Berkshire, MD 04/14/21 1009

## 2021-04-15 ENCOUNTER — Telehealth: Payer: Self-pay

## 2021-04-15 NOTE — Telephone Encounter (Signed)
Transition Care Management Unsuccessful Follow-up Telephone Call  Date of discharge and from where:  04/14/2021 from [facility]  Attempts:  1st Attempt  Reason for unsuccessful TCM follow-up call:  Left voice message

## 2021-04-17 NOTE — Telephone Encounter (Signed)
Transition Care Management Unsuccessful Follow-up Telephone Call  Date of discharge and from where:  04/14/2021 from [facility]  Attempts:  2nd Attempt  Reason for unsuccessful TCM follow-up call:  Left voice message

## 2021-04-18 NOTE — Telephone Encounter (Signed)
Transition Care Management Unsuccessful Follow-up Telephone Call  Date of discharge and from where:  04/14/2021-[place]  Attempts:  3rd Attempt  Reason for unsuccessful TCM follow-up call:  Left voice message

## 2021-04-25 ENCOUNTER — Other Ambulatory Visit: Payer: Self-pay

## 2021-04-25 ENCOUNTER — Encounter: Payer: Self-pay | Admitting: Family Medicine

## 2021-04-25 ENCOUNTER — Ambulatory Visit: Payer: Medicaid Other | Admitting: Family Medicine

## 2021-04-25 VITALS — BP 133/100 | HR 71 | Ht 62.0 in | Wt 154.0 lb

## 2021-04-25 DIAGNOSIS — Z1159 Encounter for screening for other viral diseases: Secondary | ICD-10-CM

## 2021-04-25 DIAGNOSIS — I10 Essential (primary) hypertension: Secondary | ICD-10-CM

## 2021-04-25 MED ORDER — AMLODIPINE BESYLATE 10 MG PO TABS: 10.0000 mg | ORAL_TABLET | Freq: Every day | ORAL | 1 refills | Status: DC

## 2021-04-25 NOTE — Assessment & Plan Note (Signed)
Patient reports diastolic blood pressures at home to be in the 90s.  Patient is currently on amlodipine 5 mg.  We will increase this to 10 mg and instructed patient to monitor her blood pressures at home and educated on hypotensive symptoms.  Patient has not had a BMP in the last year, we will obtain that today. If patient's blood pressure corrects to her goal that she is able to follow-up in the next several months, if it remains elevated patient to follow back up in 3-4 weeks.

## 2021-04-25 NOTE — Patient Instructions (Addendum)
I am going to increase your dose of amlodipine to 10 mg daily.  I want you to keep an eye on your blood pressures and if they become too low or you feel like you are going to pass out or really dizzy then sit down and make sure you check it and call our office.  We are going to go ahead and check lab work to check on your kidney function as well as some electrolytes like calcium and potassium.  If your blood pressures look like they are doing well then we do not need to follow-up, if they continue to remain elevated then I want you to follow-up in the next 4 weeks.

## 2021-04-25 NOTE — Progress Notes (Signed)
    SUBJECTIVE:   CHIEF COMPLAINT / HPI:   HTN Patient reports that she has been taking her blood pressures at home and they have still remained elevated with diastolics averaging in the 90s.  She reports she has not had any symptoms associated with her blood pressure, she does note that she has headaches at baseline since a head injury.  PERTINENT  PMH / PSH: Reviewed  OBJECTIVE:   BP (!) 133/100   Pulse 71   Ht 5\' 2"  (1.575 m)   Wt 154 lb (69.9 kg)   LMP 04/16/2021   SpO2 100%   BMI 28.17 kg/m   Gen: well-appearing, NAD CV: RRR, no m/r/g appreciated, no peripheral edema Pulm: CTAB, no wheezes/crackles GI: soft, non-tender, non-distended  ASSESSMENT/PLAN:   Hypertension Patient reports diastolic blood pressures at home to be in the 90s.  Patient is currently on amlodipine 5 mg.  We will increase this to 10 mg and instructed patient to monitor her blood pressures at home and educated on hypotensive symptoms.  Patient has not had a BMP in the last year, we will obtain that today. If patient's blood pressure corrects to her goal that she is able to follow-up in the next several months, if it remains elevated patient to follow back up in 3-4 weeks.   Healthcare maintenance Hepatitis C screening today  04/18/2021, DO  [facility]

## 2021-04-26 LAB — BASIC METABOLIC PANEL
BUN/Creatinine Ratio: 12 (ref 9–23)
BUN: 9 mg/dL (ref 6–20)
CO2: 23 mmol/L (ref 20–29)
Calcium: 9.7 mg/dL (ref 8.7–10.2)
Chloride: 104 mmol/L (ref 96–106)
Creatinine, Ser: 0.77 mg/dL (ref 0.57–1.00)
Glucose: 82 mg/dL (ref 70–99)
Potassium: 4.3 mmol/L (ref 3.5–5.2)
Sodium: 141 mmol/L (ref 134–144)
eGFR: 103 mL/min/{1.73_m2} (ref >59)

## 2021-04-26 LAB — HCV INTERPRETATION

## 2021-04-26 LAB — HCV AB W REFLEX TO QUANT PCR: HCV Ab: <0.1 s/co ratio (ref 0.0–0.9)

## 2021-05-23 ENCOUNTER — Ambulatory Visit: Payer: Medicaid Other | Admitting: Student

## 2021-06-20 ENCOUNTER — Ambulatory Visit: Payer: Medicaid Other | Admitting: Student

## 2021-07-18 MED ORDER — AMLODIPINE BESYLATE 10 MG PO TABS: 10.0000 mg | ORAL_TABLET | Freq: Every day | ORAL | 1 refills | Status: DC

## 2021-07-18 NOTE — Addendum Note (Signed)
Addended by: Darral Dash on: 07/18/2021 02:14 PM   Modules accepted: Orders

## 2021-09-18 ENCOUNTER — Telehealth: Payer: Self-pay

## 2021-09-18 NOTE — Telephone Encounter (Signed)
Patient calls nurse line reporting adverse reaction to amlodipine 10mg .  ? ?Patient reports since the dosage increase she has noticed facial twitching. Patient reports compliance to medication since change in November.  ? ?Patient reports, however, for ~3 days she went back down to amlodipine 5mg  and did not notice the twitching.  ? ?Patient denies any headaches, vision changes, SOB or chest pains. Patient reports she checks her BP at home and readings have been "good." ? ?Patient scheduled for evaluation on Friday.  ?

## 2021-09-19 NOTE — Progress Notes (Signed)
? ? ?  SUBJECTIVE:  ? ?CHIEF COMPLAINT / HPI:  ? ?Medication management: ?36 year old female presenting to discuss adjusting her medications.  She called our nurse line on 09/18/2021 with complaints of possible adverse reaction after increasing amlodipine from 5 mg daily to 10 mg daily.  She endorsed that she had noticed some facial twitching that started shortly after increasing to 10 mg daily.  She reduced it back to 5 mg daily after a few days if this symptom ongoing and the facial twitching resolved.  She denied other concerns or complaints.  She presents today for blood pressure check as well as to decide additional medications are needed.  She states she has been checking at home and 116/90, 115/90.  She states that 2 days ago she did start back on the 10 mg dose to see if she is having any facial twitching and is checking to see if this restarts. ? ?Elevated prolactin: ?On chart review patient had an elevated prolactin of 35.7 > 46.7 on 11/26/2019 and 12/04/2019 respectively.  These were done when performing a work-up for hirsutism.  On chart review she does not take any medications and has not taken any medications that should cause an increase in prolactin level.  At the time of her previous labs her kidney function was within normal limits, thyroid testing was within normal limits. Today she states she has no concerns.  She denies any peripheral vision loss. ? ?PERTINENT  PMH / PSH: Hypertension ? ?OBJECTIVE:  ? ?BP 130/86   Pulse 73   Ht 5\' 2"  (1.575 m)   Wt 165 lb 3.2 oz (74.9 kg)   LMP 09/19/2021   SpO2 100%   BMI 30.22 kg/m?   ? ?General: NAD, pleasant, able to participate in exam ?HEENT: Peripheral vision appears normal bilaterally from all directions ?Respiratory: No respiratory distress ?Skin: warm and dry, no rashes noted ?Psych: Normal affect and mood ? ?ASSESSMENT/PLAN:  ?  ?Hypertension: ?Reduced amlodipine from 10 mg to 5 mg daily due to concern of adverse effect with facial twitching which  improved after decreasing the dose.  She is restarted the 10 mg dose but has not taken it long enough to see if the adverse effect restarts.  Blood pressure today of 130/86.  She has seen some pressures at home with diastolics elevated up to 90.  Recommended continuing the 10 mg of amlodipine daily for a few more days to see if the adverse effect of facial twitching restarts, if it does she is going to decrease to 5 mg daily.  She is going to keep checking her blood pressures at home and if she consistently sees numbers above 135 systolic or 85 diastolic she is going let 09/21/2021 know for other blood control options.  Note that she is not currently on any contraception and wants to use caution with any medications that could cause harm to a fetus should she get pregnant. ? ?Hyperprolactinemia: ?Noted on lab work in 2021 with prolactin level of 35.7 and 46.7 on recheck.  She has not had an MRI.  She endorses no peripheral vision loss and none is seen on physical exam today.  At the time of her elevated prolactin level she also had creatinine and TSH checked which were both within normal limits. Will recheck prolactin level, if elevated will order MRI ? ?2022, DO ?[facility]  ? ? ? ?

## 2021-09-22 ENCOUNTER — Encounter: Payer: Self-pay | Admitting: Family Medicine

## 2021-09-22 ENCOUNTER — Ambulatory Visit (INDEPENDENT_AMBULATORY_CARE_PROVIDER_SITE_OTHER): Payer: Medicaid Other | Admitting: Family Medicine

## 2021-09-22 VITALS — BP 130/86 | HR 73 | Ht 62.0 in | Wt 165.2 lb

## 2021-09-22 DIAGNOSIS — E221 Hyperprolactinemia: Secondary | ICD-10-CM | POA: Diagnosis not present

## 2021-09-22 DIAGNOSIS — I159 Secondary hypertension, unspecified: Secondary | ICD-10-CM

## 2021-09-22 NOTE — Patient Instructions (Addendum)
Because she had an elevated prolactin level in the past were going to check this level today.  If it is elevated we will talk about getting a brain MRI as we discussed today.  I will let you know the result when it returns. ? ?For your blood pressure continue taking the 10 mg of amlodipine to see if your side effect starts back, if it does then you can back down to the 5 mg of amlodipine.  Keep checking your blood pressures at home and if you see numbers greater than 135 for the top number or greater than 85 for the bottom number on a consistent basis we should see you back to talk about other blood pressure options. ? ?When I call you with your results or send you a MyChart message with your results you can let me know if you would like for me to refill the amlodipine at 10 mg or 5 mg. ?

## 2021-09-23 LAB — PROLACTIN: Prolactin: 45.7 ng/mL — ABNORMAL HIGH (ref 4.8–23.3)

## 2021-09-25 ENCOUNTER — Other Ambulatory Visit: Payer: Self-pay | Admitting: Family Medicine

## 2021-09-25 ENCOUNTER — Telehealth: Payer: Self-pay

## 2021-09-25 DIAGNOSIS — E221 Hyperprolactinemia: Secondary | ICD-10-CM

## 2021-09-25 DIAGNOSIS — I10 Essential (primary) hypertension: Secondary | ICD-10-CM

## 2021-09-25 MED ORDER — AMLODIPINE BESYLATE 5 MG PO TABS: 5.0000 mg | ORAL_TABLET | Freq: Every day | ORAL | 3 refills | Status: DC

## 2021-09-25 NOTE — Telephone Encounter (Signed)
Spoke with patient informed her of her MRI at [facility] on Wed Apr. 19th at 9:90a. Patient understood. Vanessa Solis, CMA ? ?

## 2021-09-25 NOTE — Telephone Encounter (Signed)
-----   Message from Jackelyn Poling, DO sent at 09/25/2021  8:37 AM EDT ----- ?Regarding: Schedule MRI ?Hey team, ? ?Can you help schedule this MRI brain for this patient to evaluate the elevated prolactin level she has? She is aware and anticipates a call for scheduling.  ? ?Thank you, ? ?Ryan  ? ?

## 2021-09-30 ENCOUNTER — Ambulatory Visit (HOSPITAL_COMMUNITY): Payer: Medicaid Other

## 2021-10-11 ENCOUNTER — Ambulatory Visit (HOSPITAL_COMMUNITY): Payer: Medicaid Other

## 2021-10-11 ENCOUNTER — Ambulatory Visit (HOSPITAL_COMMUNITY): Admission: RE | Admit: 2021-10-11 | Payer: Medicaid Other | Source: Ambulatory Visit

## 2021-10-16 ENCOUNTER — Ambulatory Visit: Payer: Medicaid Other

## 2021-10-16 NOTE — Progress Notes (Deleted)
    SUBJECTIVE:   CHIEF COMPLAINT / HPI: Request for lab work  ***  PERTINENT  PMH / PSH: ***  OBJECTIVE:   LMP 09/19/2021   Physical Exam   ASSESSMENT/PLAN:   No problem-specific Assessment & Plan notes found for this encounter.     Ronnald Ramp, MD [facility]

## 2021-10-17 DIAGNOSIS — Z20822 Contact with and (suspected) exposure to covid-19: Secondary | ICD-10-CM | POA: Diagnosis not present

## 2021-10-17 DIAGNOSIS — Z03818 Encounter for observation for suspected exposure to other biological agents ruled out: Secondary | ICD-10-CM | POA: Diagnosis not present

## 2021-10-24 ENCOUNTER — Encounter: Payer: Medicaid Other | Admitting: Family Medicine

## 2021-11-09 ENCOUNTER — Other Ambulatory Visit: Payer: Self-pay | Admitting: Family Medicine

## 2021-11-09 ENCOUNTER — Ambulatory Visit (HOSPITAL_COMMUNITY)
Admission: RE | Admit: 2021-11-09 | Discharge: 2021-11-09 | Disposition: A | Discharge status: Home / Self Care | Payer: Federal, State, Local not specified - PPO | Source: Ambulatory Visit | Attending: Family Medicine | Admitting: Family Medicine

## 2021-11-09 DIAGNOSIS — E221 Hyperprolactinemia: Secondary | ICD-10-CM | POA: Insufficient documentation

## 2021-11-09 MED ORDER — GADOBUTROL 1 MMOL/ML IV SOLN
3.5000 mL | Freq: Once | INTRAVENOUS | Status: AC | PRN
  Administered 2021-11-09: 3.5 mL via INTRAVENOUS

## 2021-11-28 ENCOUNTER — Encounter: Payer: Self-pay | Admitting: *Deleted

## 2021-12-21 ENCOUNTER — Ambulatory Visit: Payer: Federal, State, Local not specified - PPO | Admitting: Student

## 2022-01-02 ENCOUNTER — Ambulatory Visit: Payer: Federal, State, Local not specified - PPO | Admitting: Student

## 2022-06-07 ENCOUNTER — Encounter: Payer: Self-pay | Admitting: Student

## 2022-06-07 ENCOUNTER — Ambulatory Visit (INDEPENDENT_AMBULATORY_CARE_PROVIDER_SITE_OTHER): Payer: Federal, State, Local not specified - PPO | Admitting: Student

## 2022-06-07 VITALS — BP 118/78 | HR 78 | Ht 62.0 in | Wt 156.2 lb

## 2022-06-07 DIAGNOSIS — Z3169 Encounter for other general counseling and advice on procreation: Secondary | ICD-10-CM | POA: Insufficient documentation

## 2022-06-07 DIAGNOSIS — M25551 Pain in right hip: Secondary | ICD-10-CM | POA: Diagnosis not present

## 2022-06-07 DIAGNOSIS — M25559 Pain in unspecified hip: Secondary | ICD-10-CM | POA: Insufficient documentation

## 2022-06-07 DIAGNOSIS — M25552 Pain in left hip: Secondary | ICD-10-CM

## 2022-06-07 DIAGNOSIS — I10 Essential (primary) hypertension: Secondary | ICD-10-CM

## 2022-06-07 NOTE — Assessment & Plan Note (Signed)
Patient interested in having 1 more child. Has questions regarding fertility and the AMA age. Advised her to have unprotected sexual intercourse around her time of ovulation which is about halfway through her cycle, and sporadically as well. If she has not conceived within 6-12 months, can consider fertility specialist. Encouraged her to take prenatal multivitamin and discussed importance of folic acid in the early weeks of gestation. If irregularity in cycles continues, advised her to return for further workup.  Although I think this is likely secondary to taking Plan B and having hormonal dysregulation.

## 2022-06-07 NOTE — Progress Notes (Signed)
    SUBJECTIVE:   CHIEF COMPLAINT / HPI:   Vanessa Solis is a 36 year-old female here to discuss bilateral hip pain, fertility, hypertension.  Hip pain Started 2 weeks ago, has since resolved on its own. She denied any falls or changes in exercise routine.  Works at [employer] and is getting out of the truck a lot, which she thinks might be part of the reason why she has low back pain with hip pain. Described as "in the joint."  Hypertension BP well controlled. Taking Amlodipine 5mg . No headache, dizziness,  Preconception counseling Vanessa Solis is interested in having 1 more child. She took a Plan B a few months ago, and had irregular bleeding in November with "2 periods"."  Prior to this, she used to have regular periods every month. Her LMP is November 22, she is next due on December 20. Wanted to know how her age would affect her fertility.  PERTINENT  PMH / PSH: Reviewed  OBJECTIVE:   BP 118/78   Pulse 78   Ht 5\' 2"  (1.575 m)   Wt 156 lb 3.2 oz (70.9 kg)   LMP 05/16/2022   SpO2 97%   BMI 28.57 kg/m   General: Well-appearing, no distress CV: Regular rate rhythm Respiratory: Normal work of breathing on room air B/l Hip:  No TTP. No edema. Passive log roll equivalent b/l without restriction. ROM full in all directions.  Pelvic alignment unremarkable to inspection and palpation. Non-antalgic gait without trendelenburg / unsteadiness. Greater trochanter without tenderness to palpation.No SI joint tenderness and normal minimal SI movement. FABER/FADIR test: NEG   ASSESSMENT/PLAN:   Pre-conception counseling Patient interested in having 1 more child. Has questions regarding fertility and the AMA age. Advised her to have unprotected sexual intercourse around her time of ovulation which is about halfway through her cycle, and sporadically as well. If she has not conceived within 6-12 months, can consider fertility specialist. Encouraged her to take prenatal multivitamin and  discussed importance of folic acid in the early weeks of gestation. If irregularity in cycles continues, advised her to return for further workup.  Although I think this is likely secondary to taking Plan B and having hormonal dysregulation.  Hypertension BP very well-controlled on amlodipine 5 mg.  Numbers at home are always within goal and today BP is 118/78. Discussed that we will trial her off of this medication, she will keep a blood pressure log and send me her readings and will determine if we need to continue medication or can discontinue.  Hip pain Resolved. Consider bursitis, sacroilitis. Advised to return should she develop symptoms again. Provided with home stretches     05/18/2022, DO [facility]

## 2022-06-07 NOTE — Assessment & Plan Note (Signed)
BP very well-controlled on amlodipine 5 mg.  Numbers at home are always within goal and today BP is 118/78. Discussed that we will trial her off of this medication, she will keep a blood pressure log and send me her readings and will determine if we need to continue medication or can discontinue.

## 2022-06-07 NOTE — Assessment & Plan Note (Addendum)
Resolved. Consider bursitis, sacroilitis. Advised to return should she develop symptoms again. Provided with home stretches

## 2022-06-07 NOTE — Patient Instructions (Signed)
So great to meet you today.  If your back/hip pain returns, you may start taking ibuprofen and Tylenol every 4-6 hours. Use the stretches that I provided you at home 1-2 times daily.  Stop taking amlodipine.  Check your blood pressure at home, and send me a MyChart message with the log of your blood pressures. Your goal blood pressure is 120/80 or less. If you develop chest pain, headache, shortness of breath, please seek care.  Have a wonderful holiday, Dr. Melissa Noon

## 2022-09-12 ENCOUNTER — Telehealth: Payer: Self-pay

## 2022-09-12 NOTE — Telephone Encounter (Signed)
Patient calls nurse line regarding concerns with BP. She reports that for the last week diastolic BP has been elevated in the 90's. Systolic has been in the [id number].  She stopped taking amlodipine after 12/14 office visit.   She scheduled an appointment for follow up next week with PCP.   She is asking if she should restart amlodipine now or wait until this visit.   She is asymptomatic at this time. She states that she had a slight headache a few days ago.   Will forward to PCP for further advisement.   Talbot Grumbling, RN

## 2022-09-13 NOTE — Telephone Encounter (Signed)
Pt informed. Markasia Carrol T Alizae Bechtel, CMA  

## 2022-09-18 ENCOUNTER — Ambulatory Visit (INDEPENDENT_AMBULATORY_CARE_PROVIDER_SITE_OTHER): Payer: Medicaid Other | Admitting: Student

## 2022-09-18 VITALS — BP 122/86 | HR 88 | Ht 62.0 in | Wt 149.0 lb

## 2022-09-18 DIAGNOSIS — I10 Essential (primary) hypertension: Secondary | ICD-10-CM

## 2022-09-18 NOTE — Assessment & Plan Note (Addendum)
BP well-controlled today at 122/86, off of all antihypertensives. -With shared decision making, decided to continue without amlodipine.  She is comfortable with this plan. -She will get a new blood pressure cuff at home that is validated (the cuff that she has is not on the list of validated blood pressure cuffs) and her reading was 127/101 using her home cuff compared to ours here in the office today, and will check her blood pressure once to twice per week. -Provided information on low-sodium diet as well as reducing fats in the diet. -She is active with her walking through her job with [employer]. -Follow-up in 1 month.

## 2022-09-18 NOTE — Progress Notes (Signed)
    SUBJECTIVE:   CHIEF COMPLAINT / HPI:   Vanessa Solis is a 37 year-old female with history notable for hypertension here for blood pressure follow-up.  She was previously on 5 mg of amlodipine daily, but in December 2023 we decided to trial her off of the medication, with stop date of 06/17/2022.  She called the nurse line about a week ago with concern for elevations in her blood pressure with systolics in the [id number] and diastolics in the [id number]. She was otherwise feeling well. She denies any acute concerns or complaints today, she is just trying to stay on top of her health.  She is hoping to conceive within the next year.  She is taking her prenatal vitamin daily.  PERTINENT  PMH / PSH: Hypertension, hyperprolactinemia (MRI with no pituitary adenoma in May 2023)  OBJECTIVE:   BP 122/86   Pulse 88   Ht 5\' 2"  (1.575 m)   Wt 149 lb (67.6 kg)   LMP 09/10/2022   SpO2 98%   BMI 27.25 kg/m   General: Alert and cooperative and appears to be in no acute distress Cardio: Normal S1 and S2, no S3 or S4. Rhythm is regular. No murmurs or rubs.   Pulm: Clear to auscultation bilaterally, no crackles, wheezing, or diminished breath sounds. Normal respiratory effort Extremities: No peripheral edema. Warm/ well perfused.  Strong radial pulses. Neuro: Cranial nerves grossly intact   ASSESSMENT/PLAN:   Hypertension BP well-controlled today at 122/86, off of all antihypertensives. -With shared decision making, decided to continue without amlodipine.  She is comfortable with this plan. -She will get a new blood pressure cuff at home that is validated (the cuff that she has is not on the list of validated blood pressure cuffs) and her reading was 127/101 using her home cuff compared to ours here in the office today, and will check her blood pressure once to twice per week. -Provided information on low-sodium diet as well as reducing fats in the diet. -She is active with her walking through her job  with [employer]. -Follow-up in 1 month.     Orvis Brill, [place]

## 2022-09-18 NOTE — Patient Instructions (Addendum)
Great seeing you today.  Find a new blood pressure cuff that is validated (website: Pop[employer].it) and check your blood pressure 1-2 times per week.  We will see you in 1 month for follow up.  Follow up instructions: About the DASH diet You need to eat the right amounts and right balance of food to maintain a healthy weight and reduce your risk of diseases including high blood pressure. Remember that no single item supplies all the nutrients necessary for good health. So eat a wide variety of foods, to ensure you get all you need.  Eating to lower blood pressure The DASH eating plan significantly lowered blood pressure in the recent Dietary Approaches to Stop Hypertension (DASH) study, and it may also help prevent and control high blood pressure.  DASH Eating Plan  Food Group Daily Servings* Serving Sizes  Grains 7-8 1 slice bread;  cup cereal or cooked rice, pasta, or cereal  Vegetables 4-5 1 cup raw leafy vegetable;  cup cooked vegetable; 6 ounces vegetable juice  Fruits 4-5 1 medium fresh fruit;  cup dried, frozen, or canned fruit  Low-fat and nonfat dairy 2-3 8 ounces milk; 1 cup yogurt; 1.5 ounces cheese  Meats, poultry, and fish 2 or fewer 3 ounces cooked meat, poultry, or fish  Nuts, seeds, and legumes Fewer than 1 (4-5 per week) 1.5 ounces or 1/3 cup nuts;  ounce or 2 tablespoons seeds;  cup cooked legumes  * Number of servings is based on eating 2,000 calories a day. If your caloric needs are higher or lower, you may eat more or fewer servings.  Compared with the standard food pyramid, the DASH eating plan is even richer in fruits, vegetables, and low-fat dairy foods and lower in saturated and total fat. It is also low in cholesterol; high in potassium, calcium, magnesium, and fiber; and moderately high in protein.  If you use the DASH eating plan to help prevent or control high blood pressure, make it part of a lifestyle that includes choosing foods lower in salt, maintaining a  healthy weight, and making a habit of physical activity. And if you drink alcohol, do so in moderation.  Food and medicine The DASH eating plan can lower blood pressure. But if you are taking medication for high blood pressure, don't stop taking your medication just because you are starting the diet. Instead, advise your healthcare provider of your lifestyle changes, so your medication regimen can be monitored accordingly.    A word about fats While fats don't directly raise blood pressure, they do affect the health of your heart and blood vessels. A high blood cholesterol level is a risk factor that raises your chance of developing heart disease. And fats, especially saturated fat, play a role in raising the cholesterol in your bloodstream.  Saturated fat is often found in foods from animals. This includes fatty meats, the skin of poultry, and whole-milk dairy products, such as butter, cheese, and ice cream. It also is in coconut, palm kernel and palm oils. These oils are found mostly in processed foods, such as baked goods, snack foods, and crackers.  If you use saturated fat, keep the amount small. Instead of saturated fat, try soft or liquid margarine and such oils as canola, safflower, and olive. But all kinds of fats have the same amount of calories and need to be limited to help you lose weight.

## 2022-10-19 ENCOUNTER — Encounter: Payer: Self-pay | Admitting: Student

## 2022-10-19 ENCOUNTER — Ambulatory Visit: Payer: Medicaid Other | Admitting: Student

## 2022-10-19 VITALS — BP 166/111 | HR 60 | Ht 62.0 in | Wt 147.2 lb

## 2022-10-19 DIAGNOSIS — I10 Essential (primary) hypertension: Secondary | ICD-10-CM

## 2022-10-19 MED ORDER — NIFEDIPINE ER OSMOTIC RELEASE 30 MG PO TB24: 30.0000 mg | ORAL_TABLET | Freq: Every day | ORAL | 0 refills | Status: DC

## 2022-10-19 NOTE — Assessment & Plan Note (Signed)
BP uncontrolled today at 160s over 100s, on both measurements.  Not currently on any antihypertensives. Her goal is less than 130/80.  Was previously well-controlled on amlodipine 5 mg daily, so not worried about a refractory hypertension concerning for renal artery stenosis or other secondary causes. As she is not currently on any contraceptives, and may or may not want another pregnancy, will start with nifedipine 30 mg daily. If in 1 to 2 weeks she is not having good response with decrease in blood pressure, will increase to 60 mg daily. Follow-up in 2 weeks to 1 month.  Discussed return precautions

## 2022-10-19 NOTE — Progress Notes (Addendum)
    SUBJECTIVE:   CHIEF COMPLAINT / HPI:   Vanessa Solis is a 37 year-old with a history of hypertension here for follow-up of blood pressure.  At her last visit on 09/18/2022, we shared decision making to discontinue her amlodipine 5 mg daily.  She remained well-controlled within goal, but today has significant elevation of blood pressure.  She says she has been under some stress lately and has not been able to work for a few days because all of her kids have been under the weather.  Otherwise, she feels well.  She denies any chest pain, headaches, shortness of breath, swelling in her extremities, vision changes.  PERTINENT  PMH / PSH: Hypertension  OBJECTIVE:   BP (!) 166/111   Pulse 60   Ht 5\' 2"  (1.575 m)   Wt 147 lb 3.2 oz (66.8 kg)   LMP 10/07/2022   SpO2 100%   BMI 26.92 kg/m   General: Alert and cooperative and appears to be in no acute distress Cardio: Normal S1 and S2, no S3 or S4. Rhythm is regular. No murmurs or rubs.   Pulm: Clear to auscultation bilaterally, no crackles, wheezing, or diminished breath sounds. Normal respiratory effort Extremities: No peripheral edema. Warm/ well perfused.  Strong radial pulses. Neuro: Cranial nerves grossly intact   ASSESSMENT/PLAN:   Hypertension BP uncontrolled today at 160s over 100s, on both measurements.  Not currently on any antihypertensives. Her goal is less than 130/80.  Was previously well-controlled on amlodipine 5 mg daily, so not worried about a refractory hypertension concerning for renal artery stenosis or other secondary causes. As she is not currently on any contraceptives, and may or may not want another pregnancy, will start with nifedipine 30 mg daily. If in 1 to 2 weeks she is not having good response with decrease in blood pressure, will increase to 60 mg daily. Follow-up in 2 weeks to 1 month.  Discussed return precautions     Darral Dash, DO [facility]

## 2022-10-19 NOTE — Patient Instructions (Addendum)
It was great to see you! Thank you for allowing me to participate in your care!  Our plans for today:  - Start taking your Nifedipine 30 mg daily for your blood pressure - Check your blood pressure a couple of times a week- send me a MyChart message in 1-2 weeks with your blood pressure readings. We can increase your medicine if needed - Schedule a follow up visit in 2 weeks- 1 month. Bring your blood pressure cuff with you.  Dr. Darral Dash, DO [facility]

## 2022-11-26 ENCOUNTER — Ambulatory Visit: Payer: Medicaid Other | Admitting: Student

## 2022-12-10 ENCOUNTER — Encounter: Payer: Self-pay | Admitting: Student

## 2022-12-11 ENCOUNTER — Other Ambulatory Visit: Payer: Self-pay | Admitting: Student

## 2022-12-11 MED ORDER — AMLODIPINE BESYLATE 5 MG PO TABS: 5.0000 mg | ORAL_TABLET | Freq: Every day | ORAL | 3 refills | Status: DC

## 2022-12-11 NOTE — Progress Notes (Unsigned)
Per patient request, discontinued Nifedipine (patient never started taking it) and refilled Amlodipine 5 mg daily. Discussed with patient that if she is wishing to have another pregnancy, we will need to choose a safer antihypertensive to use.  Darral Dash, DO

## 2022-12-17 ENCOUNTER — Ambulatory Visit: Payer: Medicaid Other | Admitting: Student

## 2023-01-14 ENCOUNTER — Encounter: Payer: Self-pay | Admitting: Student

## 2023-01-14 ENCOUNTER — Ambulatory Visit (INDEPENDENT_AMBULATORY_CARE_PROVIDER_SITE_OTHER): Payer: Medicaid Other | Admitting: Student

## 2023-01-14 VITALS — BP 118/88 | HR 63 | Ht 62.0 in | Wt 147.8 lb

## 2023-01-14 DIAGNOSIS — Z113 Encounter for screening for infections with a predominantly sexual mode of transmission: Secondary | ICD-10-CM

## 2023-01-14 DIAGNOSIS — Z13220 Encounter for screening for lipoid disorders: Secondary | ICD-10-CM

## 2023-01-14 DIAGNOSIS — Z114 Encounter for screening for human immunodeficiency virus [HIV]: Secondary | ICD-10-CM

## 2023-01-14 DIAGNOSIS — Z0000 Encounter for general adult medical examination without abnormal findings: Secondary | ICD-10-CM

## 2023-01-14 DIAGNOSIS — Z131 Encounter for screening for diabetes mellitus: Secondary | ICD-10-CM

## 2023-01-14 DIAGNOSIS — I159 Secondary hypertension, unspecified: Secondary | ICD-10-CM

## 2023-01-14 MED ORDER — MULTI-VITAMIN/MINERALS PO TABS: 1.0000 | ORAL_TABLET | Freq: Every day | ORAL | 0 refills | Status: AC

## 2023-01-14 NOTE — Progress Notes (Signed)
    SUBJECTIVE:   Chief compliant/HPI: annual examination  Vanessa Solis is a 37 y.o. who presents today for an annual exam.   Doing well today.  Says home life is going well with her 2 children who are 19 years old 30 years old. Still working at [employer], getting many steps and during the day.  Hypertension Taking Amlodipine 5 mg daily No headaches, chest pain, shortness of breath, vision changes.  Requesting HIV and RPR screening today.  Denies any vaginal discharge or other complaints.  She is not currently using any contraception, nor she taking a daily multivitamin but does state if she were to become pregnant she would keep the pregnancy.  Review of systems overall negative.  Updated history tabs and problem list.   OBJECTIVE:   BP 118/88   Pulse 63   Ht 5\' 2"  (1.575 m)   Wt 147 lb 12.8 oz (67 kg)   LMP 01/06/2023   SpO2 99%   BMI 27.03 kg/m   General: Well-appearing, very pleasant CV: Regular rate and rhythm Respiratory: Breathing room air, lungs are clear in all fields Abdomen: Soft, nontender and nondistended Extremities: Warm and well-perfused  ASSESSMENT/PLAN:    Annual Examination  See AVS for age appropriate recommendations.   PHQ score 0, reviewed and discussed. Blood pressure reviewed and at goal-continue amlodipine 5 mg daily.  The patient currently uses nothing for contraception.  Prescribed multivitamin with folic acid and discussed importance of taking this daily.  Considered the following items based upon USPSTF recommendations: HIV testing: ordered Syphilis if at high risk: ordered GC/CT  declined Lipid panel (nonfasting or fasting) discussed based upon AHA recommendations and ordered.  Consider repeat every 4-6 years.  Reviewed risk factors for latent tuberculosis and not indicated  Cervical cancer screening: prior Pap reviewed, repeat due in 2025 Immunizations up-to-date  Follow up in 6 months for blood pressure  check.   Darral Dash, DO [facility]

## 2023-01-14 NOTE — Patient Instructions (Signed)
It was a great to see you today.  I am glad you are doing well and you got your blood pressure under wonderful control.  I sent in a multivitamin to your pharmacy that you should take every day.  You may take this with food because sometimes it can cause stomach upset when taken on empty stomach.  Will see back in 6 months for your next checkup.  We got labs today, if anything is abnormal I will give you a call.   If you have any questions or concerns, please feel free to call the clinic.   Have a wonderful day,  Dr. Darral Dash [facility] [phone]

## 2023-01-15 LAB — COMPREHENSIVE METABOLIC PANEL
ALT: 13 IU/L (ref 0–32)
AST: 20 IU/L (ref 0–40)
Albumin: 4.7 g/dL (ref 3.9–4.9)
Alkaline Phosphatase: 60 IU/L (ref 44–121)
BUN/Creatinine Ratio: 13 (ref 9–23)
BUN: 9 mg/dL (ref 6–20)
Bilirubin Total: 0.3 mg/dL (ref 0.0–1.2)
CO2: 23 mmol/L (ref 20–29)
Calcium: 10 mg/dL (ref 8.7–10.2)
Chloride: 100 mmol/L (ref 96–106)
Creatinine, Ser: 0.67 mg/dL (ref 0.57–1.00)
Globulin, Total: 2.9 g/dL (ref 1.5–4.5)
Glucose: 88 mg/dL (ref 70–99)
Potassium: 4.4 mmol/L (ref 3.5–5.2)
Sodium: 137 mmol/L (ref 134–144)
Total Protein: 7.6 g/dL (ref 6.0–8.5)
eGFR: 115 mL/min/{1.73_m2} (ref >59)

## 2023-01-15 LAB — CBC
Hematocrit: 41.3 % (ref 34.0–46.6)
Hemoglobin: 14.4 g/dL (ref 11.1–15.9)
MCH: 32.2 pg (ref 26.6–33.0)
MCHC: 34.9 g/dL (ref 31.5–35.7)
MCV: 92 fL (ref 79–97)
Platelets: 394 10*3/uL (ref 150–450)
RBC: 4.47 x10E6/uL (ref 3.77–5.28)
RDW: 12.5 % (ref 11.7–15.4)
WBC: 6.8 10*3/uL (ref 3.4–10.8)

## 2023-01-15 LAB — LIPID PANEL
Chol/HDL Ratio: 2.2 ratio (ref 0.0–4.4)
Cholesterol, Total: 206 mg/dL — ABNORMAL HIGH (ref 100–199)
HDL: 93 mg/dL (ref >39)
LDL Chol Calc (NIH): 100 mg/dL — ABNORMAL HIGH (ref 0–99)
Triglycerides: 71 mg/dL (ref 0–149)
VLDL Cholesterol Cal: 13 mg/dL (ref 5–40)

## 2023-01-15 LAB — HIV ANTIBODY (ROUTINE TESTING W REFLEX): HIV Screen 4th Generation wRfx: NONREACTIVE

## 2023-01-15 LAB — RPR: RPR Ser Ql: NONREACTIVE

## 2023-04-03 IMAGING — CR DG SHOULDER 2+V*L*
3 series · 3 of 3 positions shown · non-contrast
Comparison: None.

CLINICAL DATA: Pain

EXAM:
LEFT SHOULDER - 2+ VIEW

[w shoulder external left]
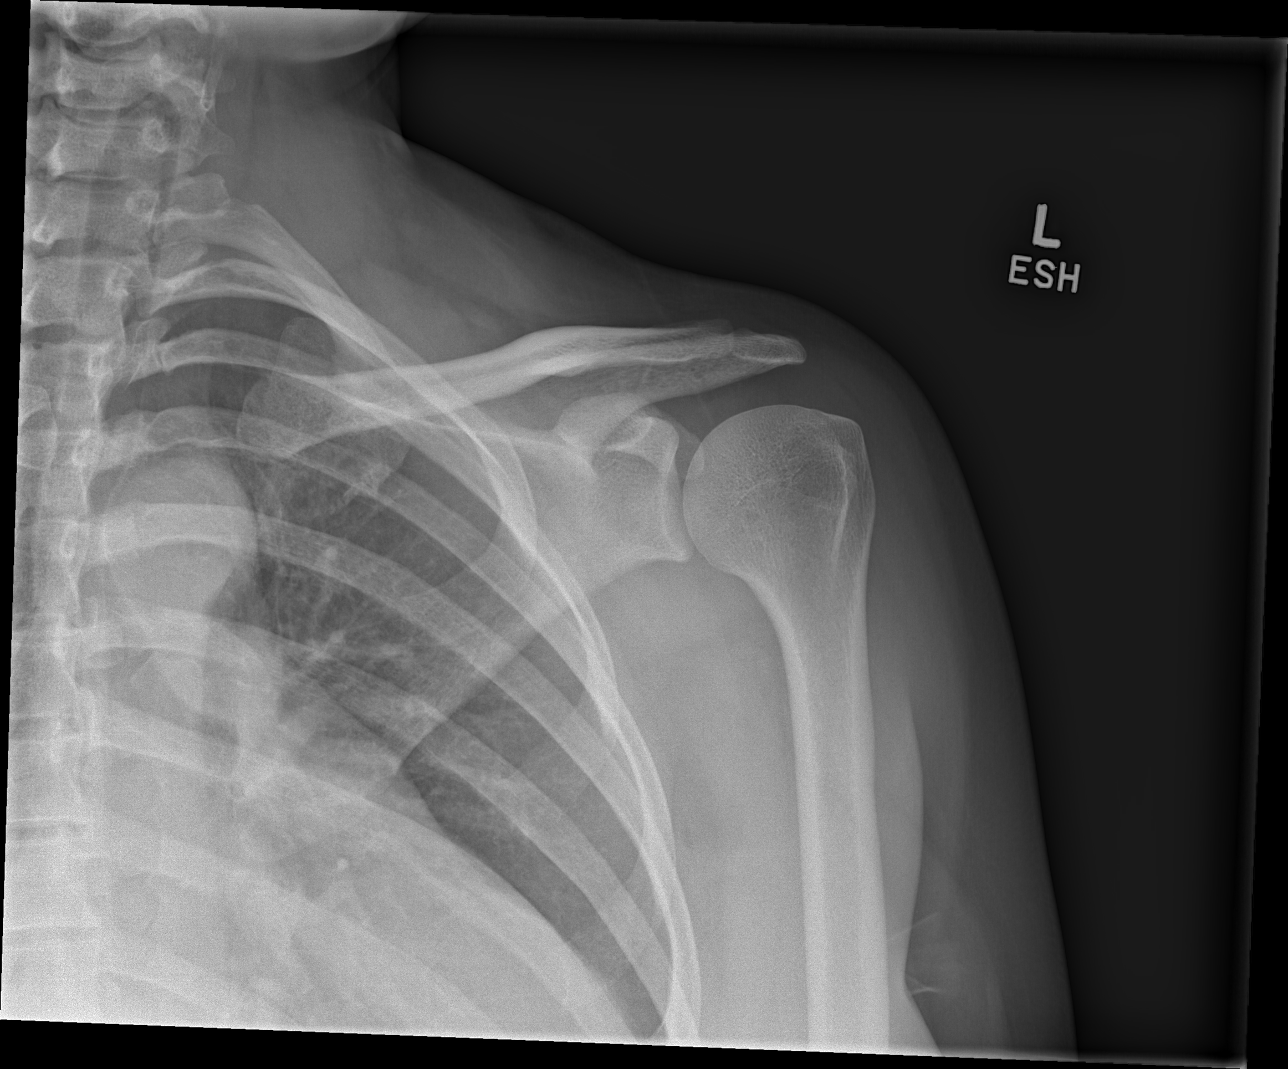

[w shoulder y-view left]
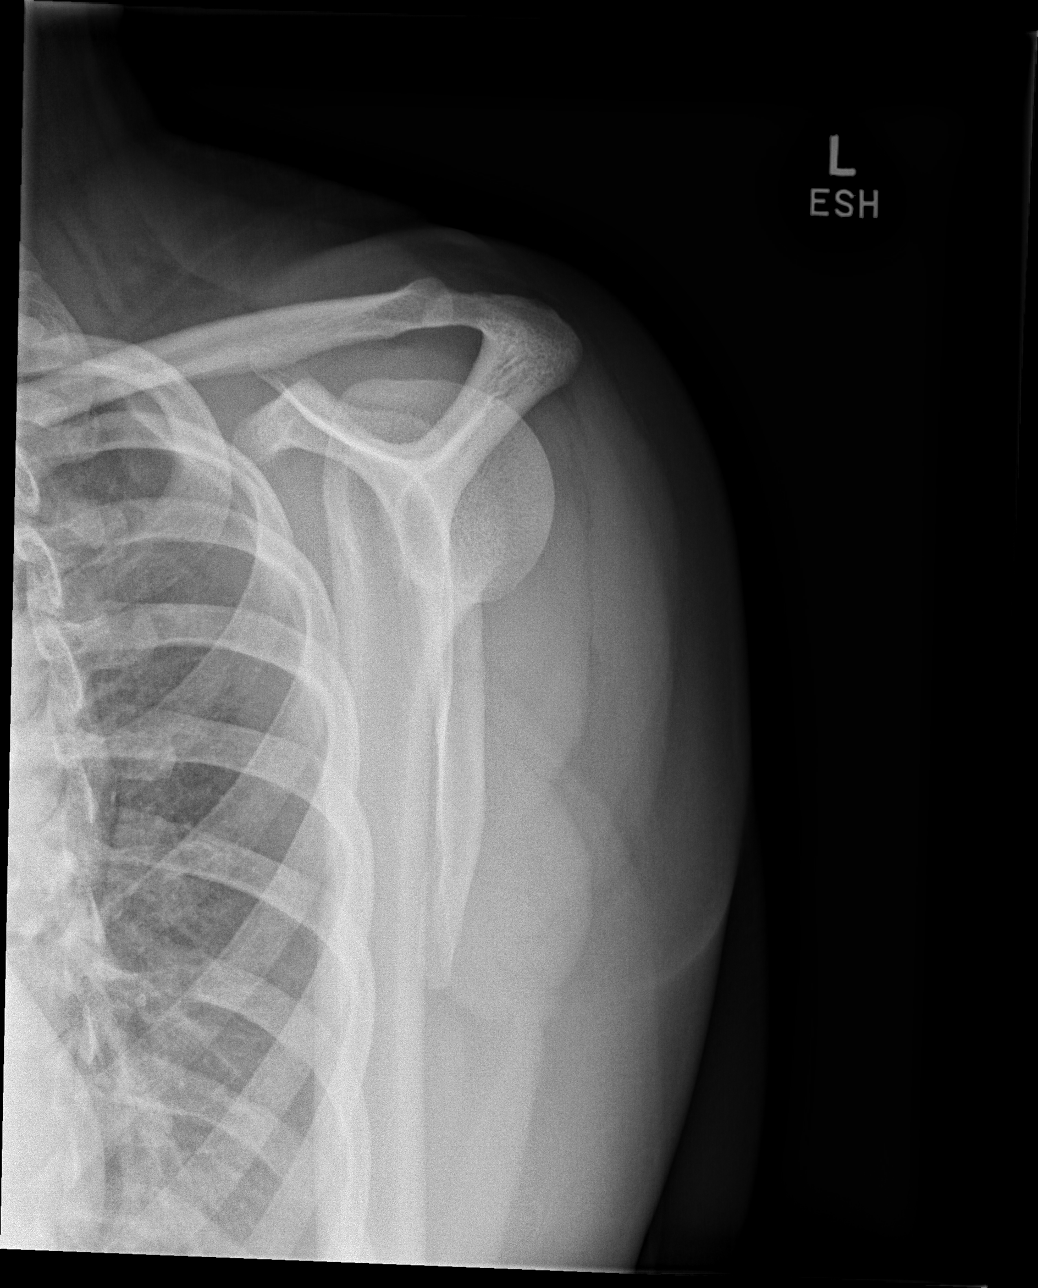

[x shoulder axillary left]
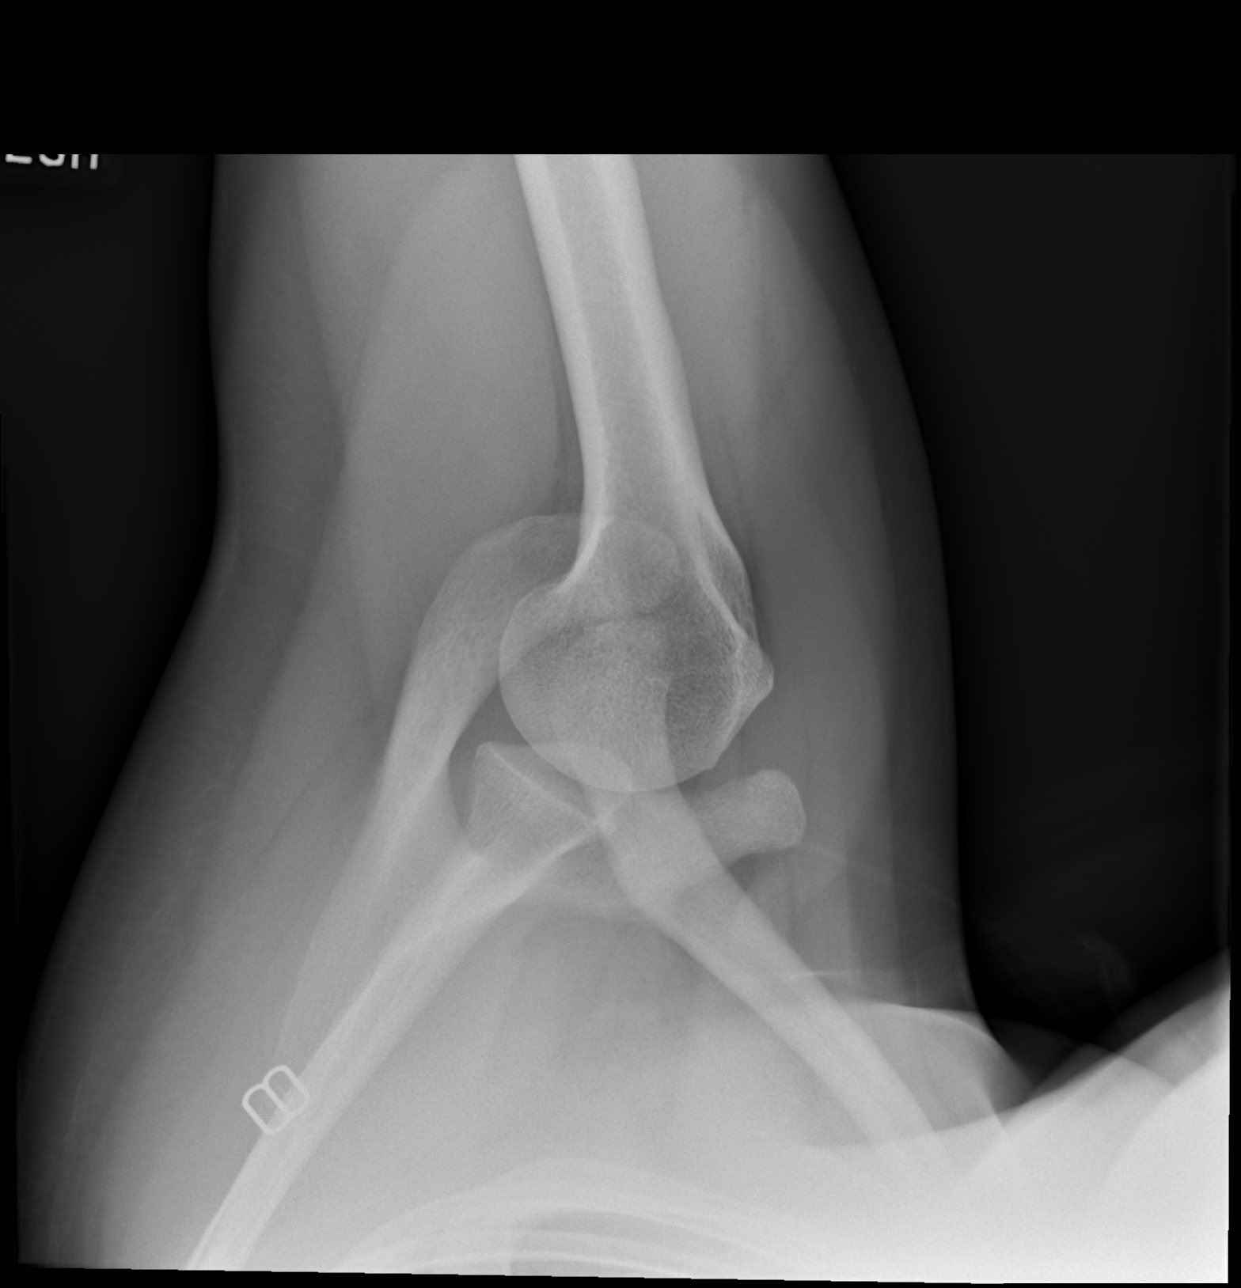

[3 of 3 positions shown; findings below may reference images not displayed]

FINDINGS: There is no acute fracture or dislocation. Shoulder alignment is
normal. The joint spaces are preserved. The soft tissues are
unremarkable.
IMPRESSION: Unremarkable shoulder radiographs.

## 2023-12-03 ENCOUNTER — Encounter: Payer: Self-pay | Admitting: *Deleted

## 2024-01-16 ENCOUNTER — Encounter: Payer: Self-pay | Admitting: Student

## 2024-01-16 ENCOUNTER — Ambulatory Visit: Payer: Self-pay | Admitting: Student

## 2024-01-16 ENCOUNTER — Ambulatory Visit: Admitting: Student

## 2024-01-16 VITALS — BP 127/92 | HR 73 | Ht 62.0 in | Wt 160.4 lb

## 2024-01-16 DIAGNOSIS — E221 Hyperprolactinemia: Secondary | ICD-10-CM

## 2024-01-16 DIAGNOSIS — R519 Headache, unspecified: Secondary | ICD-10-CM | POA: Diagnosis not present

## 2024-01-16 DIAGNOSIS — I10 Essential (primary) hypertension: Secondary | ICD-10-CM | POA: Diagnosis not present

## 2024-01-16 DIAGNOSIS — Z131 Encounter for screening for diabetes mellitus: Secondary | ICD-10-CM

## 2024-01-16 LAB — POCT GLYCOSYLATED HEMOGLOBIN (HGB A1C): Hemoglobin A1C: 4.9 % (ref 4.0–5.6)

## 2024-01-16 MED ORDER — AMLODIPINE BESYLATE 5 MG PO TABS: 5.0000 mg | ORAL_TABLET | Freq: Every day | ORAL | 3 refills | Status: AC

## 2024-01-16 NOTE — Assessment & Plan Note (Signed)
 Mildly elevated diastolic blood pressure.  Reports adherence to medications. - Continue amlodipine  5 mg daily - Recheck at next visit

## 2024-01-16 NOTE — Patient Instructions (Addendum)
 It was great to see you! Thank you for allowing me to participate in your care!   I recommend that you always bring your medications to each appointment as this makes it easy to ensure we are on the correct medications and helps us  not miss when refills are needed.  Our plans for today:  - Please schedule an annual exam with Pap smear with your PCP at your earliest convenience - Please follow-up with Dr. Howell for workup of your head pressure at your earliest convenience -Additionally, we have sent a referral to ophthalmology for an eye exam, you should receive a call in the next 2-4 weeks - We are checking some labs today, I will call you if they are abnormal will send you a MyChart message or a letter if they are normal.  If you do not hear about your labs in the next 2 weeks please let us  know.  Take care and seek immediate care sooner if you develop any concerns. Please remember to show up 15 minutes before your scheduled appointment time!  Gladis Howell, DO [facility]

## 2024-01-16 NOTE — Progress Notes (Signed)
    SUBJECTIVE:   Chief compliant/HPI:  Pressure in head Symptoms began 2 months ago, having pressure in head when she breathes and yawns.  Denies significant headaches, no throbbing.  Does note back of head gets numb sometimes, especially when she lies down.  No new medications.  Symptoms typically happen during the day at work.  She has no nausea when waking or during episodes.  She denies any numbness or tingling, no weakness.  Does have some trouble with sleep, multiple nightly wakings.  No new stressors of note.  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  She has had 2 children, not currently on birth control.  Reports regular menstrual cycle.  OBJECTIVE:   BP (!) 127/92   Pulse 73   Ht 5' 2 (1.575 m)   Wt 160 lb 6.4 oz (72.8 kg)   LMP 01/15/2024   SpO2 100%   BMI 29.34 kg/m    General: NAD, pleasant HEENT: Normocephalic, atraumatic head Cardio: RRR, no MRG. Cap Refill <2s. Respiratory: CTAB, normal wob on RA Skin: Warm and dry Neuro CN II: PERRL CN III, IV,VI: EOMI CV V: Normal sensation in V1, V2, V3 CVII: Symmetric smile and brow raise CN VIII: Normal hearing CN IX,X: Symmetric palate raise  CN XI: 5/5 shoulder shrug CN XII: Symmetric tongue protrusion  UE and LE strength 5/5 Normal gait  ASSESSMENT/PLAN:   Assessment & Plan Pressure in head Differential: Rule out IIH (empty sella on MRI several years ago), prolactinoma (see below), migraines, hypothyroidism, tension headache.  Lower concern for NPH based on age and no other symptoms. - TSH, CBC, BMP, - Referral to ophthalmology for eye exam - Follow-up in 2-4 weeks - ED precautions discussed Hyperprolactinemia (HCC) - History of hyperprolactinemia - Repeat lab today - Consider repeat MRI if level still elevated Diabetes mellitus screening -A1c  today Primary hypertension Mildly elevated diastolic blood pressure.  Reports adherence to medications. - Continue amlodipine  5 mg daily - Recheck at next visit  Follow-up recommendations She has annual exam with PCP (female preference for Pap smear) next month. Additionally, she will follow-up in approximately 2 weeks with Dr. Howell for repeat neuroexam and discussion of symptoms.

## 2024-01-16 NOTE — Assessment & Plan Note (Signed)
-   History of hyperprolactinemia - Repeat lab today - Consider repeat MRI if level still elevated

## 2024-01-17 LAB — CBC WITH DIFFERENTIAL/PLATELET
Basophils Absolute: 0 x10E3/uL (ref 0.0–0.2)
Basos: 1 %
EOS (ABSOLUTE): 0.2 x10E3/uL (ref 0.0–0.4)
Eos: 3 %
Hematocrit: 45.1 % (ref 34.0–46.6)
Hemoglobin: 14.4 g/dL (ref 11.1–15.9)
Immature Grans (Abs): 0 x10E3/uL (ref 0.0–0.1)
Immature Granulocytes: 0 %
Lymphocytes Absolute: 1.5 x10E3/uL (ref 0.7–3.1)
Lymphs: 27 %
MCH: 31.6 pg (ref 26.6–33.0)
MCHC: 31.9 g/dL (ref 31.5–35.7)
MCV: 99 fL — ABNORMAL HIGH (ref 79–97)
Monocytes Absolute: 0.5 x10E3/uL (ref 0.1–0.9)
Monocytes: 9 %
Neutrophils Absolute: 3.3 x10E3/uL (ref 1.4–7.0)
Neutrophils: 60 %
Platelets: 334 x10E3/uL (ref 150–450)
RBC: 4.55 x10E6/uL (ref 3.77–5.28)
RDW: 12.9 % (ref 11.7–15.4)
WBC: 5.5 x10E3/uL (ref 3.4–10.8)

## 2024-01-17 LAB — BASIC METABOLIC PANEL WITH GFR
BUN/Creatinine Ratio: 10 (ref 9–23)
BUN: 8 mg/dL (ref 6–20)
CO2: 22 mmol/L (ref 20–29)
Calcium: 9.8 mg/dL (ref 8.7–10.2)
Chloride: 103 mmol/L (ref 96–106)
Creatinine, Ser: 0.79 mg/dL (ref 0.57–1.00)
Glucose: 80 mg/dL (ref 70–99)
Potassium: 4.5 mmol/L (ref 3.5–5.2)
Sodium: 140 mmol/L (ref 134–144)
eGFR: 98 mL/min/1.73 (ref >59)

## 2024-01-17 LAB — TSH: TSH: 0.816 u[IU]/mL (ref 0.450–4.500)

## 2024-01-17 LAB — PROLACTIN: Prolactin: 52.9 ng/mL — ABNORMAL HIGH (ref 4.8–33.4)

## 2024-01-17 NOTE — Telephone Encounter (Signed)
 Called patient to inform of elevated prolactin levels. Patient continues to not have blurry vision. Headache has not worsened.  Informed that we have ordered a brain MRI to evaluate the pituitary as well as placed a neurology referral.  Recommended going to the ED if she has worsening headache or develops vision changes.  Has follow up with Dr. Howell on 8/11.

## 2024-01-20 ENCOUNTER — Encounter

## 2024-01-24 ENCOUNTER — Ambulatory Visit (HOSPITAL_COMMUNITY)

## 2024-01-28 ENCOUNTER — Ambulatory Visit (HOSPITAL_COMMUNITY): Admission: RE | Admit: 2024-01-28 | Source: Ambulatory Visit

## 2024-02-03 ENCOUNTER — Ambulatory Visit: Admitting: Student

## 2024-02-19 ENCOUNTER — Ambulatory Visit
